# Patient Record
Sex: Female | Born: 1975 | Hispanic: Yes | Marital: Single | Smoking: Never smoker
Health system: Southern US, Community
[De-identification: ages and names within clinical notes are randomized; demographics above are authoritative.]

## PROBLEM LIST (undated history)

## (undated) ENCOUNTER — Inpatient Hospital Stay (HOSPITAL_COMMUNITY): Payer: Self-pay

## (undated) DIAGNOSIS — B91 Sequelae of poliomyelitis: Secondary | ICD-10-CM

## (undated) DIAGNOSIS — M89669 Osteopathy after poliomyelitis, unspecified lower leg: Secondary | ICD-10-CM

## (undated) HISTORY — DX: Sequelae of poliomyelitis: B91

## (undated) HISTORY — DX: Osteopathy after poliomyelitis, unspecified lower leg: M89.669

## (undated) HISTORY — PX: LEG SURGERY: SHX1003

---
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
## 2007-10-04 ENCOUNTER — Ambulatory Visit: Payer: Self-pay | Admitting: Obstetrics & Gynecology

## 2007-10-05 ENCOUNTER — Ambulatory Visit (HOSPITAL_COMMUNITY): Admission: RE | Admit: 2007-10-05 | Discharge: 2007-10-05 | Payer: Self-pay | Admitting: Obstetrics & Gynecology

## 2007-10-18 ENCOUNTER — Ambulatory Visit: Payer: Self-pay | Admitting: Obstetrics & Gynecology

## 2007-11-01 ENCOUNTER — Ambulatory Visit: Payer: Self-pay | Admitting: Obstetrics & Gynecology

## 2007-11-22 ENCOUNTER — Ambulatory Visit: Payer: Self-pay | Admitting: Obstetrics & Gynecology

## 2007-11-29 ENCOUNTER — Ambulatory Visit: Payer: Self-pay | Admitting: Obstetrics & Gynecology

## 2007-12-06 ENCOUNTER — Ambulatory Visit: Payer: Self-pay | Admitting: Obstetrics & Gynecology

## 2007-12-07 ENCOUNTER — Ambulatory Visit: Payer: Self-pay | Admitting: Advanced Practice Midwife

## 2007-12-07 ENCOUNTER — Inpatient Hospital Stay (HOSPITAL_COMMUNITY): Admission: AD | Admit: 2007-12-07 | Discharge: 2007-12-09 | Payer: Self-pay | Admitting: Obstetrics & Gynecology

## 2010-08-04 ENCOUNTER — Emergency Department (HOSPITAL_COMMUNITY)
Admission: EM | Admit: 2010-08-04 | Discharge: 2010-08-05 | Payer: Self-pay | Source: Home / Self Care | Admitting: Emergency Medicine

## 2010-10-12 LAB — POCT I-STAT, CHEM 8
BUN: <3 mg/dL — ABNORMAL LOW (ref 6–23)
Calcium, Ion: 1.13 mmol/L (ref 1.12–1.32)
Chloride: 109 mEq/L (ref 96–112)
Creatinine, Ser: 0.4 mg/dL (ref 0.4–1.2)
Glucose, Bld: 106 mg/dL — ABNORMAL HIGH (ref 70–99)
HCT: 34 % — ABNORMAL LOW (ref 36.0–46.0)
Hemoglobin: 11.6 g/dL — ABNORMAL LOW (ref 12.0–15.0)
Potassium: 3.2 mEq/L — ABNORMAL LOW (ref 3.5–5.1)
Sodium: 140 mEq/L (ref 135–145)
TCO2: 20 mmol/L (ref 0–100)

## 2010-10-12 LAB — WET PREP, GENITAL
Clue Cells Wet Prep HPF POC: NONE SEEN
Trich, Wet Prep: NONE SEEN
WBC, Wet Prep HPF POC: NONE SEEN
Yeast Wet Prep HPF POC: NONE SEEN

## 2010-10-12 LAB — ABO/RH: ABO/RH(D): O POS

## 2010-10-12 LAB — GC/CHLAMYDIA PROBE AMP, GENITAL
Chlamydia, DNA Probe: NEGATIVE
GC Probe Amp, Genital: NEGATIVE

## 2010-10-14 ENCOUNTER — Inpatient Hospital Stay (HOSPITAL_COMMUNITY): Payer: Medicaid Other

## 2010-10-14 ENCOUNTER — Inpatient Hospital Stay (HOSPITAL_COMMUNITY)
Admission: AD | Admit: 2010-10-14 | Discharge: 2010-10-14 | Disposition: A | Discharge status: Home / Self Care | Payer: Medicaid Other | Source: Ambulatory Visit | Attending: Family Medicine | Admitting: Family Medicine

## 2010-10-14 DIAGNOSIS — O4690 Antepartum hemorrhage, unspecified, unspecified trimester: Secondary | ICD-10-CM

## 2010-10-14 LAB — CBC
HCT: 36.5 % (ref 36.0–46.0)
Hemoglobin: 12.1 g/dL (ref 12.0–15.0)
MCH: 29.5 pg (ref 26.0–34.0)
MCHC: 33.2 g/dL (ref 30.0–36.0)
MCV: 89 fL (ref 78.0–100.0)
Platelets: 244 10*3/uL (ref 150–400)
RBC: 4.1 MIL/uL (ref 3.87–5.11)
RDW: 13.8 % (ref 11.5–15.5)
WBC: 9.2 10*3/uL (ref 4.0–10.5)

## 2010-10-14 LAB — WET PREP, GENITAL
Clue Cells Wet Prep HPF POC: NONE SEEN
Trich, Wet Prep: NONE SEEN
Yeast Wet Prep HPF POC: NONE SEEN

## 2010-10-15 LAB — GC/CHLAMYDIA PROBE AMP, GENITAL
Chlamydia, DNA Probe: NEGATIVE
GC Probe Amp, Genital: NEGATIVE

## 2010-10-21 ENCOUNTER — Encounter: Payer: Self-pay | Admitting: Family Medicine

## 2010-10-21 LAB — CONVERTED CEMR LAB
INR: 0.9 (ref <1.50)
INR: 0.9 (ref <1.50)
Prothrombin Time: 12.4 s (ref 11.6–15.2)
Prothrombin Time: 12.4 s (ref 11.6–15.2)
aPTT: 28 s (ref 24–37)
aPTT: 28 s (ref 24–37)

## 2010-10-21 LAB — APTT: aPTT: 28 seconds (ref 24–37)

## 2010-10-21 LAB — PROTIME-INR
INR: 0.9 (ref <1.50)
Prothrombin Time: 12.4 seconds (ref 11.6–15.2)

## 2011-02-02 ENCOUNTER — Inpatient Hospital Stay (HOSPITAL_COMMUNITY)
Admission: AD | Admit: 2011-02-02 | Discharge: 2011-02-03 | DRG: 775 | Disposition: A | Discharge status: Home / Self Care | Payer: Medicaid Other | Source: Ambulatory Visit | Attending: Obstetrics & Gynecology | Admitting: Obstetrics & Gynecology

## 2011-02-02 DIAGNOSIS — O09529 Supervision of elderly multigravida, unspecified trimester: Secondary | ICD-10-CM | POA: Diagnosis present

## 2011-02-02 LAB — CBC
HCT: 41.5 % (ref 36.0–46.0)
Hemoglobin: 14.1 g/dL (ref 12.0–15.0)
MCH: 29.7 pg (ref 26.0–34.0)
MCHC: 34 g/dL (ref 30.0–36.0)
MCV: 87.4 fL (ref 78.0–100.0)
Platelets: 218 10*3/uL (ref 150–400)
RBC: 4.75 MIL/uL (ref 3.87–5.11)
RDW: 14.1 % (ref 11.5–15.5)
WBC: 8.9 10*3/uL (ref 4.0–10.5)

## 2011-02-02 LAB — RPR: RPR Ser Ql: NONREACTIVE

## 2011-04-26 LAB — POCT URINALYSIS DIP (DEVICE)
Bilirubin Urine: NEGATIVE
Glucose, UA: NEGATIVE
Ketones, ur: NEGATIVE
Nitrite: NEGATIVE
Operator id: 297281
Protein, ur: NEGATIVE
Specific Gravity, Urine: 1.015
Urobilinogen, UA: 0.2
pH: 7

## 2011-04-27 LAB — POCT URINALYSIS DIP (DEVICE)
Bilirubin Urine: NEGATIVE
Bilirubin Urine: NEGATIVE
Bilirubin Urine: NEGATIVE
Glucose, UA: NEGATIVE
Glucose, UA: NEGATIVE
Glucose, UA: NEGATIVE
Hgb urine dipstick: NEGATIVE
Hgb urine dipstick: NEGATIVE
Hgb urine dipstick: NEGATIVE
Ketones, ur: NEGATIVE
Ketones, ur: NEGATIVE
Ketones, ur: NEGATIVE
Nitrite: NEGATIVE
Nitrite: NEGATIVE
Nitrite: NEGATIVE
Operator id: 159681
Operator id: 297281
Operator id: 297281
Protein, ur: NEGATIVE
Protein, ur: NEGATIVE
Protein, ur: NEGATIVE
Specific Gravity, Urine: <=1.005
Specific Gravity, Urine: 1.01
Specific Gravity, Urine: 1.01
Urobilinogen, UA: 0.2
Urobilinogen, UA: 0.2
Urobilinogen, UA: 0.2
pH: 6.5
pH: 6.5
pH: 7

## 2012-02-02 ENCOUNTER — Ambulatory Visit (INDEPENDENT_AMBULATORY_CARE_PROVIDER_SITE_OTHER): Payer: Self-pay | Admitting: Family Medicine

## 2012-02-02 ENCOUNTER — Encounter: Payer: Self-pay | Admitting: Family Medicine

## 2012-02-02 VITALS — BP 116/78 | HR 65 | Ht 59.5 in | Wt 110.0 lb

## 2012-02-02 DIAGNOSIS — R51 Headache: Secondary | ICD-10-CM

## 2012-02-02 DIAGNOSIS — B91 Sequelae of poliomyelitis: Secondary | ICD-10-CM

## 2012-02-02 DIAGNOSIS — A809 Acute poliomyelitis, unspecified: Secondary | ICD-10-CM

## 2012-02-02 DIAGNOSIS — R519 Headache, unspecified: Secondary | ICD-10-CM | POA: Insufficient documentation

## 2012-02-02 DIAGNOSIS — M89669 Osteopathy after poliomyelitis, unspecified lower leg: Secondary | ICD-10-CM | POA: Insufficient documentation

## 2012-02-02 MED ORDER — KETOROLAC TROMETHAMINE 30 MG/ML IJ SOLN
30.0000 mg | Freq: Once | INTRAMUSCULAR | Status: AC
  Administered 2012-02-02: 30 mg via INTRAMUSCULAR

## 2012-02-02 NOTE — Progress Notes (Signed)
Subjective:     Patient ID: Jade Horn, female   DOB: [birth year], 36 y.o.   MRN: [id number]  HPI Patient is a new patient. She has not been seen by PCP in the past. She is here to establish care and has a concern about her headache and crutches use.  1. Headache- No history of headaches. Headache started 3 weeks ago and has not completely resolved. Has taken Advil which helped. Some photophobia, no nausea/vomiting. Some blurry vision, but not new or related to headaches. She has 2 children and would like to feel better. She does have changes in vision for a while now but not related to the headaches, although difficulty seeing at a distance could be contributing.   2. Pain in hand- Pain has been going on for a long time, but worse within the last week. Most likely secondary to using crutches. She has used standard crutches for 20 years, and never used any other type of support. Endorses numbness and tingling in her arms. She states she also has some pain down her sides which she feels is also from using her crutches. No fevers, no decreased ROM, no redness of joints.   3. Health Maintenance - Needs an eye appointment. BP within normal limits. Last pap smear during pregnancy last year. Awaiting Orange Card  Patient's social worker is with her, and has interpreted her entire interview and exam.   Review of Systems Please see HPI above    Objective:   Physical Exam  Constitutional: She appears well-developed and well-nourished. No distress.  HENT:  Head: Normocephalic and atraumatic.  Neck: Normal range of motion.  Cardiovascular: Normal rate and regular rhythm.   No murmur heard. Pulmonary/Chest: Effort normal and breath sounds normal.  Abdominal: Soft. There is no tenderness.  Musculoskeletal: Normal range of motion. She exhibits no edema and no tenderness.       Good grip bilaterally. Decreased ROM on right hip. Unable to flex/extend right knee or ankle. Atrophy of right leg.  Multiple healed surgical scars on right thigh.  Lymphadenopathy:    She has no cervical adenopathy.   Assessment:     36 female establishing care    Plan:

## 2012-02-02 NOTE — Patient Instructions (Signed)
It was nice to meet you today!  I hope your headache feels better soon. You can take Tylenol Extra Strength (500mg ) every 6 hours for the pain, OR Take Excedrin Headache relief. (Please do not take these together.)  Once you have the orange card, we will arrange for you to be seen at Sports Medicine to discuss your crutches.  Also, I think it is important for you to make an eye appointment, if possible. Your vision could be contributing to your headache.  Happy birthday to your little girl! I will see you back in the office in 2 months, or sooner if you need anything.  Druscilla Petsch M. Jeffren Dombek, M.D.

## 2012-02-02 NOTE — Assessment & Plan Note (Addendum)
Tried OTC nsaids with relief. Will give Toradol shot here in the office today since abortive Rx are expensive. Also, patient is concerned about feeling sleepy so we will start with Toradol only. At home, she can continue to take Motrin, Excedrin or Tylenol as needed for the pain. If her headache worsens, she has changes in her vision, or has any other concerns she should return to clinic to be evaluated.

## 2012-02-02 NOTE — Assessment & Plan Note (Signed)
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 She has always walked with standard crutches. Recently with her headache and fatigue, she has noticed she has had increased numbness down arms and pain in flanks from the crutches. She does not have any other option for walking. Since patient does not have insurance, I called Sports Medicine clinic. They stated they would not be able to offer much else for the crutches. She should go to a medical supply store to see if they have more appropriate crutches for her. I wrote a prescription stating her problem and for eval/treat for new crutches. Patient states understanding. Will follow up in 1-2 months, or sooner if needed.

## 2014-06-03 ENCOUNTER — Encounter: Payer: Self-pay | Admitting: Family Medicine

## 2015-08-03 NOTE — L&D Delivery Note (Signed)
Delivery Note Progressed quickly to complete dilation. Pushed well to complete the SVD.   At 4:52 PM a viable and healthy female was delivered via Vaginal, Spontaneous Delivery (Presentation: LOA ).  APGAR: 8, 9; weight pending .   Placenta status: Spontaneous and grossly intact with 3VC,   with the following complications: none   Small left Bartholins cyst noted after delivery. No signs of abscess.  Anesthesia:   Episiotomy: None Lacerations: Perineal abrasion, not repaired Suture Repair: none Est. Blood Loss (mL): 100  Mom to postpartum.  Baby to Couplet care / Skin to Skin.  [facility] Surgery CenterWILLIAMS,Jade Fiallos 05/17/2016, 5:17 PM

## 2015-09-12 LAB — CYTOLOGY - PAP: PAP SMEAR: NEGATIVE

## 2015-10-27 ENCOUNTER — Encounter (HOSPITAL_COMMUNITY): Payer: Self-pay | Admitting: *Deleted

## 2015-10-27 ENCOUNTER — Inpatient Hospital Stay (HOSPITAL_COMMUNITY)
Admission: AD | Admit: 2015-10-27 | Discharge: 2015-10-27 | Disposition: A | Discharge status: Home / Self Care | Payer: Self-pay | Source: Ambulatory Visit | Attending: Family Medicine | Admitting: Family Medicine

## 2015-10-27 DIAGNOSIS — O200 Threatened abortion: Secondary | ICD-10-CM

## 2015-10-27 DIAGNOSIS — O209 Hemorrhage in early pregnancy, unspecified: Secondary | ICD-10-CM | POA: Insufficient documentation

## 2015-10-27 DIAGNOSIS — Z3A11 11 weeks gestation of pregnancy: Secondary | ICD-10-CM | POA: Insufficient documentation

## 2015-10-27 LAB — ABO/RH: ABO/RH(D): O POS

## 2015-10-27 LAB — URINALYSIS, ROUTINE W REFLEX MICROSCOPIC
BILIRUBIN URINE: NEGATIVE
GLUCOSE, UA: NEGATIVE mg/dL
KETONES UR: NEGATIVE mg/dL
Nitrite: NEGATIVE
PROTEIN: NEGATIVE mg/dL
Specific Gravity, Urine: 1.01 (ref 1.005–1.030)
pH: 6 (ref 5.0–8.0)

## 2015-10-27 LAB — URINE MICROSCOPIC-ADD ON: Bacteria, UA: NONE SEEN

## 2015-10-27 NOTE — Discharge Instructions (Signed)
Hemorragia vaginal durante el embarazo (primer trimestre)  (Vaginal Bleeding During Pregnancy, First Trimester)  Durante los primeros meses del embarazo es relativamente frecuente que se presente una pequeña hemorragia (manchas). Esta situación generalmente mejora por sí misma. Estas hemorragias o manchas tienen diversas causas al inicio del embarazo. Algunas manchas pueden estar relacionadas al embarazo y otras no. En la mayoría de los casos, la hemorragia es normal y no es un problema. Sin embargo, la hemorragia también puede ser un signo de algo grave. Debe informar a su médico de inmediato si tiene alguna hemorragia vaginal.  Algunas causas posibles de hemorragia vaginal durante el primer trimestre incluyen:  · Infección o inflamación del cuello del útero.  · Crecimientos anormales (pólipos) en el cuello del útero.  · Aborto espontáneo o amenaza de aborto espontáneo.  · Tejido del embarazo se ha desarrollado fuera del útero y en una trompa de falopio (embarazo ectópico).  · Se han desarrollado pequeños quistes en el útero en lugar de tejido de embarazo (embarazo molar).  INSTRUCCIONES PARA EL CUIDADO EN EL HOGAR   Controle su afección para ver si hay cambios. Las siguientes indicaciones ayudarán a aliviar cualquier molestia que pueda sentir:  · Siga las indicaciones del médico para restringir su actividad. Si el médico le indica descanso en la cama, debe quedarse en la cama y levantarse solo para ir al baño. No obstante, el médico puede permitirle que continué con tareas livianas.  · Si es necesario, organícese para que alguien le ayude con las actividades y responsabilidades cotidianas mientras está en cama.  · Lleve un registro de la cantidad y la saturación de las toallas higiénicas que utiliza cada día. Anote este dato.  · No use tampones.No se haga duchas vaginales.  · No tenga relaciones sexuales u orgasmos hasta que el médico la autorice.  · Si elimina tejido por la vagina, guárdelo para mostrárselo al  médico.  · Tome solo medicamentos de venta libre o recetados, según las indicaciones del médico.  · No tome aspirina, ya que puede causar hemorragias.  · Cumpla con todas las visitas de control, según le indique su médico.  SOLICITE ATENCIÓN MÉDICA SI:  · Tiene un sangrado vaginal en cualquier momento del embarazo.  · Tiene calambres o dolores de parto.  · Tiene fiebre que los medicamentos no pueden controlar.  SOLICITE ATENCIÓN MÉDICA DE INMEDIATO SI:   · Siente calambres intensos en la espalda o en el vientre (abdomen).  · Elimina coágulos grandes o tejido por la vagina.  · La hemorragia aumenta.  · Si se siente mareada, débil o se desmaya.  · Tiene escalofríos.  · Tiene una pérdida importante o sale líquido a borbotones por la vagina.  · Se desmaya mientras defeca.  ASEGÚRESE DE QUE:  · Comprende estas instrucciones.  · Controlará su afección.  · Recibirá ayuda de inmediato si no mejora o si empeora.     Esta información no tiene como fin reemplazar el consejo del médico. Asegúrese de hacerle al médico cualquier pregunta que tenga.     Document Released: 04/28/2005 Document Revised: 07/24/2013  Elsevier Interactive Patient Education ©2016 [employer] Inc.

## 2015-10-27 NOTE — MAU Note (Signed)
Patient doing well, no needs at this time. Awaiting lab results.

## 2015-10-27 NOTE — MAU Provider Note (Signed)
History   G3P2002 in c/o vaginal bleeding that started yesterday also passed sm clot yesterday. Today has only been mild cramping and spotting.  CSN: [id number]  Arrival date & time 10/27/15  1221   None     Chief Complaint  Patient presents with  . Vaginal Bleeding  . Abdominal Cramping    HPI  Past Medical History  Diagnosis Date  . Medical history non-contributory     Past Surgical History  Procedure Laterality Date  . Leg surgery Right     6 times right leg and foot, due to congenital abnormality    No family history on file.  Social History  Substance Use Topics  . Smoking status: None  . Smokeless tobacco: None  . Alcohol Use: None    OB History    Gravida Para Term Preterm AB TAB SAB Ectopic Multiple Living   3         2      Review of Systems  Constitutional: Negative.   HENT: Negative.   Eyes: Negative.   Respiratory: Negative.   Cardiovascular: Negative.   Gastrointestinal: Positive for abdominal pain.  Endocrine: Negative.   Genitourinary: Positive for vaginal bleeding.  Musculoskeletal: Negative.   Skin: Negative.   Allergic/Immunologic: Negative.   Neurological: Negative.   Hematological: Negative.   Psychiatric/Behavioral: Negative.     Allergies  Review of patient's allergies indicates no known allergies.  Home Medications  No current outpatient prescriptions on file.  BP 114/56 mmHg  Pulse 74  Temp(Src) 99.6 F (37.6 C) (Oral)  Resp 18  LMP 08/12/2015 (Approximate)  Physical Exam  Constitutional: She is oriented to person, place, and time. She appears well-developed and well-nourished.  HENT:  Head: Normocephalic.  Right Ear: External ear normal.  Mouth/Throat: Oropharynx is clear and moist.  Eyes: Pupils are equal, round, and reactive to light.  Neck: Normal range of motion.  Cardiovascular: Normal rate, regular rhythm, normal heart sounds and intact distal pulses.   Pulmonary/Chest: Effort normal and breath sounds  normal.  Abdominal: Soft. Bowel sounds are normal.  Genitourinary: Vagina normal and uterus normal.  Musculoskeletal: Normal range of motion.  Neurological: She is alert and oriented to person, place, and time. She has normal reflexes.  Skin: Skin is warm and dry.  Psychiatric: She has a normal mood and affect. Her behavior is normal. Judgment and thought content normal.    MAU Course  Procedures (including critical care time)  Labs Reviewed  URINALYSIS, ROUTINE W REFLEX MICROSCOPIC (NOT AT [facility])  ABO/RH   No results found.   No diagnosis found.    MDM  FHR 164 st and reg with doppler. No active bleeding present with exam. Will ABO and rh. Then d/c home with precautions. Will send message to low risk clinic to get pt in for care, will get OP u/s.

## 2015-11-04 ENCOUNTER — Ambulatory Visit (HOSPITAL_COMMUNITY)
Admission: RE | Admit: 2015-11-04 | Discharge: 2015-11-04 | Disposition: A | Discharge status: Home / Self Care | Payer: Self-pay | Source: Ambulatory Visit | Attending: Obstetrics and Gynecology | Admitting: Obstetrics and Gynecology

## 2015-11-04 ENCOUNTER — Encounter: Payer: Self-pay | Admitting: Obstetrics & Gynecology

## 2015-11-04 ENCOUNTER — Other Ambulatory Visit (HOSPITAL_COMMUNITY): Payer: Self-pay | Admitting: Obstetrics and Gynecology

## 2015-11-04 DIAGNOSIS — Z3A11 11 weeks gestation of pregnancy: Secondary | ICD-10-CM | POA: Insufficient documentation

## 2015-11-04 DIAGNOSIS — O209 Hemorrhage in early pregnancy, unspecified: Secondary | ICD-10-CM

## 2015-11-04 DIAGNOSIS — O200 Threatened abortion: Secondary | ICD-10-CM | POA: Insufficient documentation

## 2015-11-04 NOTE — Progress Notes (Signed)
Ultrasounds Results Note  SUBJECTIVE HPI:  [name] is a 40 y.o. G3P0 at 8133w0d by LMP who is here for followup ultrasound results.   Patient is Spanish-speaking only, Spanish interpreter present for this encounter.  She was seen in the MAU on 10/27/2015 for bleeding in early pregnancy.  FHR was opbtained by doppler, U/S scheduled for today.  The patient denies abdominal pain but endorses small amount of vaginal bleeding.     Past Medical History  Diagnosis Date  . Medical history non-contributory    Past Surgical History  Procedure Laterality Date  . Leg surgery Right     6 times right leg and foot, due to congenital abnormality   Social History   Social History  . Marital Status: Single    Spouse Name: N/A  . Number of Children: N/A  . Years of Education: N/A   Occupational History  . Not on file.   Social History Main Topics  . Smoking status: Not on file  . Smokeless tobacco: Not on file  . Alcohol Use: Not on file  . Drug Use: Not on file  . Sexual Activity: Yes   Other Topics Concern  . Not on file   Social History Narrative  . No narrative on file   Current Outpatient Prescriptions on File Prior to Encounter  Medication Sig Dispense Refill  . Prenatal Vit-Fe Fumarate-FA (PRENATAL MULTIVITAMIN) TABS tablet Take 1 tablet by mouth daily at 12 noon.     No current facility-administered medications on file prior to encounter.   No Known Allergies  I have reviewed patient's Past Medical Hx, Surgical Hx, Family Hx, Social Hx, medications and allergies.   Review of Systems Review of Systems  Constitutional: Negative for fever and chills.  Gastrointestinal: Negative for nausea, vomiting, abdominal pain, diarrhea and constipation.  Genitourinary: Negative for dysuria.  Musculoskeletal: Negative for back pain.  Neurological: Negative for dizziness and weakness.    Physical Exam  LMP 08/12/2015 (Approximate)  GENERAL: Well-developed,  well-nourished female in no acute distress.  HEENT: Normocephalic, atraumatic.   LUNGS: Effort normal ABDOMEN: soft, non-tender HEART: Regular rate  SKIN: Warm, dry and without erythema PSYCH: Normal mood and affect NEURO: Alert and oriented x 4   IMAGING [place] Ob Comp Less 14 Wks  11/04/2015  CLINICAL DATA:  Vaginal bleeding in first trimester pregnancy. Threatened abortion. Gestational age by LMP of 12 weeks 0 days EXAM: OBSTETRIC <14 WK ULTRASOUND TECHNIQUE: Transabdominal ultrasound was performed for evaluation of the gestation as well as the maternal uterus and adnexal regions. COMPARISON:  None. FINDINGS: Intrauterine gestational sac: Single Yolk sac:  Visualized Embryo:  Visualized Cardiac Activity: Visualized Heart Rate: 171 bpm CRL:   42  mm   11 w 0 d                  US EDC: 05/25/2016 Subchorionic hemorrhage:  Small subchorionic hemorrhage noted. Maternal uterus/adnexae: Probable tiny less than 1 cm fibroid in the posterior uterine fundus. Both ovaries are normal in appearance. No adnexal mass or free fluid identified. IMPRESSION: Single living IUP measuring 11 weeks 0 days with US EDC of 05/25/2016. Small subchorionic hemorrhage and probable tiny less than 1 cm posterior uterine fibroid. Electronically Signed   By: Jade Horn  Stahl M.D.   On: 11/04/2015 15:23    ASSESSMENT 1. Threatened abortion in early pregnancy   2. Vaginal bleeding in pregnancy, first trimester     PLAN Discharge home in stable condition Patient  advised to continue taking prenatal vitamins Pregnancy confirmation letter given Patient advised to start prenatal care with [facility] provider of choice as soon as possible  Jade Newcomer, MD  11/04/2015  3:39 PM

## 2015-11-25 ENCOUNTER — Telehealth: Payer: Self-pay

## 2015-11-25 ENCOUNTER — Ambulatory Visit (INDEPENDENT_AMBULATORY_CARE_PROVIDER_SITE_OTHER): Payer: Self-pay | Admitting: Obstetrics and Gynecology

## 2015-11-25 ENCOUNTER — Encounter: Payer: Self-pay | Admitting: Obstetrics and Gynecology

## 2015-11-25 VITALS — BP 114/61 | HR 77 | Temp 98.5°F | Wt 129.4 lb

## 2015-11-25 DIAGNOSIS — Z3490 Encounter for supervision of normal pregnancy, unspecified, unspecified trimester: Secondary | ICD-10-CM | POA: Insufficient documentation

## 2015-11-25 DIAGNOSIS — Z3492 Encounter for supervision of normal pregnancy, unspecified, second trimester: Secondary | ICD-10-CM

## 2015-11-25 DIAGNOSIS — O09529 Supervision of elderly multigravida, unspecified trimester: Secondary | ICD-10-CM | POA: Insufficient documentation

## 2015-11-25 DIAGNOSIS — Z3402 Encounter for supervision of normal first pregnancy, second trimester: Secondary | ICD-10-CM

## 2015-11-25 DIAGNOSIS — Z789 Other specified health status: Secondary | ICD-10-CM

## 2015-11-25 DIAGNOSIS — Z758 Other problems related to medical facilities and other health care: Secondary | ICD-10-CM | POA: Insufficient documentation

## 2015-11-25 DIAGNOSIS — O09522 Supervision of elderly multigravida, second trimester: Secondary | ICD-10-CM

## 2015-11-25 DIAGNOSIS — Z113 Encounter for screening for infections with a predominantly sexual mode of transmission: Secondary | ICD-10-CM

## 2015-11-25 DIAGNOSIS — O24419 Gestational diabetes mellitus in pregnancy, unspecified control: Secondary | ICD-10-CM

## 2015-11-25 LAB — POCT URINALYSIS DIP (DEVICE)
Bilirubin Urine: NEGATIVE
Glucose, UA: NEGATIVE mg/dL
Ketones, ur: NEGATIVE mg/dL
NITRITE: NEGATIVE
PH: 6.5 (ref 5.0–8.0)
PROTEIN: NEGATIVE mg/dL
Specific Gravity, Urine: >=1.03 (ref 1.005–1.030)
UROBILINOGEN UA: 1 mg/dL (ref 0.0–1.0)

## 2015-11-25 NOTE — Telephone Encounter (Signed)
Error

## 2015-11-25 NOTE — Patient Instructions (Signed)

## 2015-11-25 NOTE — Progress Notes (Signed)
Fundus at [id number]  Subjective:    Jade HackerCatalina Horn is a G3P0 5923w0d being seen today for her first obstetrical visit.  Her obstetrical history is significant for advanced maternal age. Patient does intend to breast feed. Pregnancy history fully reviewed.  Patient reports no complaints.  Filed Vitals:   11/25/15 1324  BP: 114/61  Pulse: 77  Temp: 98.5 F (36.9 C)  Weight: 129 lb 6.4 oz (58.695 kg)    HISTORY: OB History  Gravida Para Term Preterm AB SAB TAB Ectopic Multiple Living  3         2    # Outcome Date GA Lbr Len/2nd Weight Sex Delivery Anes PTL Lv  3 Current           2 Gravida 02/02/11   7 lb (3.175 kg) F Vag-Spont   Y  1 Gravida 12/07/07   7 lb (3.175 kg)  Vag-Spont  Y      Past Medical History  Diagnosis Date  . Medical history non-contributory    Past Surgical History  Procedure Laterality Date  . Leg surgery Right     6 times right leg and foot, due to congenital abnormality   Family History  Problem Relation Age of Onset  . Diabetes Father      Exam    Uterus:   14-16 wk size  Pelvic Exam:    Perineum: No Hemorrhoids, Normal Perineum   Vulva: normal, Bartholin's, Urethra, Skene's normal   Vagina:  normal mucosa, normal discharge       Cervix: multiparous appearance   Adnexa: not evaluated   Bony Pelvis: average  System: Breast:  normal appearance, no masses or tenderness, Inspection negative, Normal to palpation without dominant masses   Skin: normal coloration and turgor, no rashes    Neurologic: oriented, normal, grossly non-focal   Extremities: normal strength, tone, and muscle mass, no musculoskeletal defects noted   HEENT PERRLA, sclera clear, anicteric, neck supple with midline trachea and thyroid without masses   Mouth/Teeth mucous membranes moist, pharynx normal without lesions and dental hygiene good   Neck supple and no masses   Cardiovascular: regular rate and rhythm, no murmurs or gallops   Respiratory:  appears well,  vitals normal, no respiratory distress, acyanotic, normal RR, ear and throat exam is normal, neck free of mass or lymphadenopathy, chest clear, no wheezing, crepitations, rhonchi, normal symmetric air entry   Abdomen: soft, non-tender; bowel sounds normal; no masses,  no organomegaly   Urinary: urethral meatus normal      Assessment:    Pregnancy: G3P0 Patient Active Problem List   Diagnosis Date Noted  . Encounter for supervision of normal pregnancy in second trimester, antepartum 11/25/2015  . AMA (advanced maternal age) multigravida 35+ 11/25/2015  . Language barrier 11/25/2015        Plan:     Initial labs drawn. Early GCT. [facility] Pap done Feb 2017 Quad screen Prenatal vitamins. Problem list reviewed and updated. Genetic Screening discussed Quad Screen: requested.  Ultrasound discussed; fetal survey: requested.ordered  Follow up here in 4 weeks. Genetic counseling  50% of 30 min visit spent on counseling and coordination of care.     POE,DEIRDRE 11/25/2015

## 2015-11-26 LAB — GLUCOSE TOLERANCE, 1 HOUR (50G) W/O FASTING: Glucose, 1 Hr, gestational: 145 mg/dL — ABNORMAL HIGH (ref <140)

## 2015-11-26 LAB — PRENATAL PROFILE (SOLSTAS)
Antibody Screen: NEGATIVE
Basophils Absolute: 0 cells/uL (ref 0–200)
Basophils Relative: 0 %
EOS PCT: 1 %
Eosinophils Absolute: 93 cells/uL (ref 15–500)
HEMATOCRIT: 36.6 % (ref 35.0–45.0)
HIV: NONREACTIVE
Hemoglobin: 12 g/dL (ref 11.7–15.5)
Hepatitis B Surface Ag: NEGATIVE
LYMPHS PCT: 24 %
Lymphs Abs: 2232 cells/uL (ref 850–3900)
MCH: 28.2 pg (ref 27.0–33.0)
MCHC: 32.8 g/dL (ref 32.0–36.0)
MCV: 85.9 fL (ref 80.0–100.0)
MONO ABS: 651 {cells}/uL (ref 200–950)
MPV: 10.3 fL (ref 7.5–12.5)
Monocytes Relative: 7 %
NEUTROS PCT: 68 %
Neutro Abs: 6324 cells/uL (ref 1500–7800)
Platelets: 310 10*3/uL (ref 140–400)
RBC: 4.26 MIL/uL (ref 3.80–5.10)
RDW: 14 % (ref 11.0–15.0)
RH TYPE: POSITIVE
Rubella: 1.76 Index — ABNORMAL HIGH (ref <0.90)
WBC: 9.3 10*3/uL (ref 3.8–10.8)

## 2015-11-26 LAB — GC/CHLAMYDIA PROBE AMP (~~LOC~~) NOT AT ARMC
Chlamydia: NEGATIVE
Neisseria Gonorrhea: NEGATIVE

## 2015-11-27 LAB — PRESCRIPTION MONITORING PROFILE (19 PANEL)
AMPHETAMINE/METH: NEGATIVE ng/mL
Barbiturate Screen, Urine: NEGATIVE ng/mL
Benzodiazepine Screen, Urine: NEGATIVE ng/mL
Buprenorphine, Urine: NEGATIVE ng/mL
CANNABINOID SCRN UR: NEGATIVE ng/mL
CARISOPRODOL, URINE: NEGATIVE ng/mL
Cocaine Metabolites: NEGATIVE ng/mL
Creatinine, Urine: 101.68 mg/dL (ref >20.0)
Fentanyl, Ur: NEGATIVE ng/mL
MDMA URINE: NEGATIVE ng/mL
MEPERIDINE UR: NEGATIVE ng/mL
METHADONE SCREEN, URINE: NEGATIVE ng/mL
Methaqualone: NEGATIVE ng/mL
NITRITES URINE, INITIAL: NEGATIVE ug/mL
Opiate Screen, Urine: NEGATIVE ng/mL
Oxycodone Screen, Ur: NEGATIVE ng/mL
PROPOXYPHENE: NEGATIVE ng/mL
Phencyclidine, Ur: NEGATIVE ng/mL
TAPENTADOLUR: NEGATIVE ng/mL
Tramadol Scrn, Ur: NEGATIVE ng/mL
Zolpidem, Urine: NEGATIVE ng/mL
pH, Initial: 6.7 pH (ref 4.5–8.9)

## 2015-11-27 LAB — HEMOGLOBINOPATHY EVALUATION
HGB A2 QUANT: 3 % (ref 2.2–3.2)
HGB F QUANT: 0 % (ref 0.0–2.0)
Hemoglobin Other: 0 %
Hgb A: 97 % (ref 96.8–97.8)
Hgb S Quant: 0 %

## 2015-11-27 LAB — CULTURE, OB URINE
Colony Count: NO GROWTH
ORGANISM ID, BACTERIA: NO GROWTH

## 2015-12-02 ENCOUNTER — Telehealth: Payer: Self-pay

## 2015-12-02 ENCOUNTER — Encounter: Payer: Self-pay | Admitting: Family Medicine

## 2015-12-02 NOTE — Telephone Encounter (Signed)
Patient to be schedule for 3 hour gtt

## 2015-12-03 ENCOUNTER — Encounter: Payer: Self-pay | Admitting: Family Medicine

## 2015-12-03 ENCOUNTER — Encounter: Payer: Self-pay | Admitting: *Deleted

## 2015-12-03 ENCOUNTER — Encounter (HOSPITAL_COMMUNITY): Payer: Self-pay

## 2015-12-03 DIAGNOSIS — Z3480 Encounter for supervision of other normal pregnancy, unspecified trimester: Secondary | ICD-10-CM

## 2015-12-03 NOTE — Telephone Encounter (Signed)
Pt has already been scheduled for her 3 hour

## 2015-12-09 ENCOUNTER — Other Ambulatory Visit: Payer: Self-pay

## 2015-12-09 DIAGNOSIS — Z3480 Encounter for supervision of other normal pregnancy, unspecified trimester: Secondary | ICD-10-CM

## 2015-12-09 DIAGNOSIS — R7309 Other abnormal glucose: Secondary | ICD-10-CM

## 2015-12-10 LAB — GLUCOSE TOLERANCE, 3 HOURS
Glucose Tolerance, 1 hour: 95 mg/dL (ref <190)
Glucose Tolerance, 2 hour: 111 mg/dL (ref <165)
Glucose Tolerance, Fasting: 88 mg/dL (ref 65–104)
Glucose, GTT - 3 Hour: 97 mg/dL (ref <145)

## 2015-12-23 ENCOUNTER — Encounter: Payer: Self-pay | Admitting: Medical

## 2016-01-07 ENCOUNTER — Encounter: Payer: Self-pay | Admitting: Advanced Practice Midwife

## 2016-01-07 ENCOUNTER — Ambulatory Visit (INDEPENDENT_AMBULATORY_CARE_PROVIDER_SITE_OTHER): Payer: Self-pay | Admitting: Advanced Practice Midwife

## 2016-01-07 VITALS — BP 106/69 | HR 68 | Temp 98.0°F | Wt 136.7 lb

## 2016-01-07 DIAGNOSIS — Z3482 Encounter for supervision of other normal pregnancy, second trimester: Secondary | ICD-10-CM

## 2016-01-07 DIAGNOSIS — Z3492 Encounter for supervision of normal pregnancy, unspecified, second trimester: Secondary | ICD-10-CM

## 2016-01-07 DIAGNOSIS — N898 Other specified noninflammatory disorders of vagina: Secondary | ICD-10-CM

## 2016-01-07 DIAGNOSIS — Z113 Encounter for screening for infections with a predominantly sexual mode of transmission: Secondary | ICD-10-CM

## 2016-01-07 DIAGNOSIS — Z364 Encounter for antenatal screening for fetal growth retardation: Secondary | ICD-10-CM

## 2016-01-07 DIAGNOSIS — O26892 Other specified pregnancy related conditions, second trimester: Secondary | ICD-10-CM

## 2016-01-07 LAB — POCT URINALYSIS DIP (DEVICE)
Bilirubin Urine: NEGATIVE
Glucose, UA: NEGATIVE mg/dL
Hgb urine dipstick: NEGATIVE
Ketones, ur: NEGATIVE mg/dL
Nitrite: NEGATIVE
Protein, ur: NEGATIVE mg/dL
Specific Gravity, Urine: 1.015 (ref 1.005–1.030)
Urobilinogen, UA: 0.2 mg/dL (ref 0.0–1.0)
pH: 7 (ref 5.0–8.0)

## 2016-01-07 NOTE — Progress Notes (Signed)
Anatomy US scheduled for June 14th @ 1100.  Pt notified.

## 2016-01-07 NOTE — Progress Notes (Signed)
Subjective:  Jade Horn is a 40 y.o. G3P2001 at 7180w1d being seen today for ongoing prenatal care.  She is currently monitored for the following issues for this low-risk pregnancy and has Headache(784.0); Polio osteopathy of lower leg (HCC); Supervision of normal pregnancy, antepartum; AMA (advanced maternal age) multigravida 35+; and Language barrier on her problem list. Visit completed with use of Spanish interpreter.  Patient reports no bleeding, no contractions, no cramping, no leaking of fluid, but complains of mild vaginal discharge with odor.  Contractions: Not present. Vag. Bleeding: None.  Movement: Absent. Denies leaking of fluid.   The following portions of the patient's history were reviewed and updated as appropriate: allergies, current medications, past family history, past medical history, past social history, past surgical history and problem list. Problem list updated.  Objective:   Filed Vitals:   01/07/16 0958  BP: 106/69  Pulse: 68  Temp: 98 F (36.7 C)  Weight: 136 lb 11.2 oz (62.007 kg)    Fetal Status: Fetal Heart Rate (bpm): 150 Fundal Height: 22 cm Movement: Absent     General:  Alert, oriented and cooperative. Patient is in no acute distress.  Skin: Skin is warm and dry. No rash noted.   Cardiovascular: Normal heart rate noted  Respiratory: Normal respiratory effort, no problems with respiration noted  Abdomen: Soft, gravid, appropriate for gestational age. Pain/Pressure: Absent     Pelvic: Vag. Bleeding: None Vag D/C Character: none  Cervical exam deferred       Wet prep and GC/Chlamydia collected  Extremities: Normal range of motion.  Edema: None  Mental Status: Normal mood and affect. Normal behavior. Normal judgment and thought content.   Urinalysis: Urine Protein: Negative Urine Glucose: Negative  Assessment and Plan:  Pregnancy: G3P2001 at 2580w1d  1. Antenatal screening for fetal growth retardation using ultrasonics - US MFM OB COMP + 14  WK; Next week  2. Supervision of normal pregnancy in second trimester - US MFM OB COMP + 14 WK; Future  3. Vaginal discharge during pregnancy in second trimester - Wet prep, genital - GC/Chlamydia probe amp ([place]  Preterm labor symptoms and general obstetric precautions including but not limited to vaginal bleeding, contractions, leaking of fluid and fetal movement were reviewed in detail with the patient. Please refer to After Visit Summary for other counseling recommendations.  Return in about 4 weeks (around 02/04/2016).   Lowella Dellolleen L Blessed Girdner, Med Student

## 2016-01-07 NOTE — Patient Instructions (Signed)

## 2016-01-07 NOTE — Progress Notes (Signed)
Pt reports that she had bleeding in April but her appt was canceled because pt reported to MAU they told her she was fine.  Pt states concern today about vaginal discharge that smells "brownish"   Pt reports not feeling fetal movement and that she has only felt it once.

## 2016-01-08 ENCOUNTER — Telehealth: Payer: Self-pay | Admitting: General Practice

## 2016-01-08 DIAGNOSIS — B9689 Other specified bacterial agents as the cause of diseases classified elsewhere: Secondary | ICD-10-CM

## 2016-01-08 DIAGNOSIS — N760 Acute vaginitis: Principal | ICD-10-CM

## 2016-01-08 LAB — WET PREP, GENITAL
Trich, Wet Prep: NONE SEEN
Yeast Wet Prep HPF POC: NONE SEEN

## 2016-01-08 LAB — GC/CHLAMYDIA PROBE AMP (~~LOC~~) NOT AT ARMC
Chlamydia: NEGATIVE
Neisseria Gonorrhea: NEGATIVE

## 2016-01-08 MED ORDER — METRONIDAZOLE 500 MG PO TABS: 500.0000 mg | ORAL_TABLET | Freq: Two times a day (BID) | ORAL | Status: DC

## 2016-01-08 NOTE — Telephone Encounter (Signed)
Per [name], patient has BV and needs flagyl sent to pharmacy. Called patient with [name] for interpreter & informed her of results & medication sent to pharmacy. Patient verbalized understanding & had no questions

## 2016-01-08 NOTE — Progress Notes (Signed)
I repeated the exam and agree with above.  LoyolaVirginia Emmie Frakes, CNM 01/08/2016 10:08 AM

## 2016-01-13 ENCOUNTER — Encounter (HOSPITAL_COMMUNITY): Payer: Self-pay

## 2016-01-13 ENCOUNTER — Ambulatory Visit (HOSPITAL_COMMUNITY): Payer: Self-pay

## 2016-01-14 ENCOUNTER — Ambulatory Visit (HOSPITAL_COMMUNITY): Admission: RE | Admit: 2016-01-14 | Payer: Self-pay | Source: Ambulatory Visit

## 2016-01-14 ENCOUNTER — Encounter (HOSPITAL_COMMUNITY): Payer: Self-pay

## 2016-02-17 ENCOUNTER — Encounter: Payer: Self-pay | Admitting: Clinical

## 2016-02-17 ENCOUNTER — Ambulatory Visit (INDEPENDENT_AMBULATORY_CARE_PROVIDER_SITE_OTHER): Payer: Self-pay | Admitting: Advanced Practice Midwife

## 2016-02-17 ENCOUNTER — Encounter: Payer: Self-pay | Admitting: Advanced Practice Midwife

## 2016-02-17 VITALS — BP 118/71 | HR 79 | Wt 142.5 lb

## 2016-02-17 DIAGNOSIS — Z3482 Encounter for supervision of other normal pregnancy, second trimester: Secondary | ICD-10-CM

## 2016-02-17 DIAGNOSIS — Z23 Encounter for immunization: Secondary | ICD-10-CM

## 2016-02-17 LAB — CBC
HCT: 31 % — ABNORMAL LOW (ref 35.0–45.0)
Hemoglobin: 10.2 g/dL — ABNORMAL LOW (ref 11.7–15.5)
MCH: 26.2 pg — ABNORMAL LOW (ref 27.0–33.0)
MCHC: 32.9 g/dL (ref 32.0–36.0)
MCV: 79.5 fL — ABNORMAL LOW (ref 80.0–100.0)
MPV: 10.5 fL (ref 7.5–12.5)
Platelets: 341 10*3/uL (ref 140–400)
RBC: 3.9 MIL/uL (ref 3.80–5.10)
RDW: 13.5 % (ref 11.0–15.0)
WBC: 8.9 10*3/uL (ref 3.8–10.8)

## 2016-02-17 LAB — POCT URINALYSIS DIP (DEVICE)
Bilirubin Urine: NEGATIVE
Glucose, UA: NEGATIVE mg/dL
Hgb urine dipstick: NEGATIVE
Ketones, ur: NEGATIVE mg/dL
Nitrite: NEGATIVE
Protein, ur: NEGATIVE mg/dL
Specific Gravity, Urine: 1.015 (ref 1.005–1.030)
Urobilinogen, UA: 0.2 mg/dL (ref 0.0–1.0)
pH: 6.5 (ref 5.0–8.0)

## 2016-02-17 MED ORDER — TETANUS-DIPHTH-ACELL PERTUSSIS 5-2.5-18.5 LF-MCG/0.5 IM SUSP
0.5000 mL | Freq: Once | INTRAMUSCULAR | Status: AC
  Administered 2016-02-17: 0.5 mL via INTRAMUSCULAR

## 2016-02-17 NOTE — Progress Notes (Signed)
Subjective:  Sarajane MarekCatarina Rodriguez-Ortiz is a 40 y.o. G3P2002 at 1615w0d being seen today for ongoing prenatal care.  She is currently monitored for the following issues for this low-risk pregnancy and has Headache(784.0); Polio osteopathy of lower leg (HCC); Supervision of normal pregnancy, antepartum; AMA (advanced maternal age) multigravida 35+; and Language barrier on her problem list.  Patient reports no complaints.  Contractions: Not present.  .  Movement: Present. Denies leaking of fluid.   The following portions of the patient's history were reviewed and updated as appropriate: allergies, current medications, past family history, past medical history, past social history, past surgical history and problem list. Problem list updated.  Objective:   Filed Vitals:   02/17/16 1120  BP: 118/71  Pulse: 79  Weight: 142 lb 8 oz (64.638 kg)    Fetal Status:     Movement: Present     General:  Alert, oriented and cooperative. Patient is in no acute distress.  Skin: Skin is warm and dry. No rash noted.   Cardiovascular: Normal heart rate noted  Respiratory: Normal respiratory effort, no problems with respiration noted  Abdomen: Soft, gravid, appropriate for gestational age. Pain/Pressure: Present     Pelvic:  Cervical exam deferred        Extremities: Normal range of motion.  Edema: None  Mental Status: Normal mood and affect. Normal behavior. Normal judgment and thought content.   Urinalysis:      Assessment and Plan:  Pregnancy: G3P2002 at 2315w0d  1. Supervision of normal pregnancy, antepartum, second trimester     Interpretor used via video     Feels well - Tdap (BOOSTRIX) injection 0.5 mL; Inject 0.5 mLs into the muscle once. - RPR - HIV antibody (with reflex) - CBC - Glucose Tolerance, 1 HR (50g)  Preterm labor symptoms and general obstetric precautions including but not limited to vaginal bleeding, contractions, leaking of fluid and fetal movement were reviewed in detail with the  patient. Please refer to After Visit Summary for other counseling recommendations.  Return in about 4 weeks (around 03/16/2016) for [facility].   Aviva SignsMarie L Williams, CNM

## 2016-02-17 NOTE — Patient Instructions (Signed)
Eleccin del mtodo anticonceptivo (Contraception Choices) La anticoncepcin (control de la natalidad) es el uso de cualquier mtodo o dispositivo para evitar el embarazo. A continuacin se indican algunos de esos mtodos. MTODOS HORMONALES   El Implante contraconceptivo consiste en un tubo plstico delgado que contiene la hormona progesterona. No contiene estrgenos. El mdico inserta el tubo en la parte interna del brazo. El tubo puede permanecer en el lugar durante 3 aos. Despus de los 3 aos debe retirarse. El implante impide que los ovarios liberen vulos (ovulacin), espesa el moco cervical, lo que evita que los espermatozoides ingresen al tero y hace ms delgada la membrana que cubre el interior del tero.  Inyecciones de progesterona sola: las administra el mdico cada 3 meses para evitar el embarazo. La progesterona sinttica impide que los ovarios liberen vulos. Tambin hacen que el moco cervical se espese y modifique el tejido de recubrimiento interno del tero. Esto hace ms difcil que los espermatozoides sobrevivan en el tero.  Las pldoras anticonceptivas contienen estrgenos y progesterona. Su funcin es evitar que los ovarios liberen vulos (ovulacin). Las hormonas de los anticonceptivos orales hacen que el moco cervical se haga ms espeso, lo que evita que el esperma ingrese al tero. Las pldoras anticonceptivas son recetadas por el mdico.Tambin se utilizan para tratar los perodos menstruales abundantes.  Minipldora: este tipo de pldora anticonceptiva contiene slo hormona progesterona. Deben tomarse todos los das del mes y debe recetarlas el mdico.  El parche de control de natalidad: contiene hormonas similares a las que contienen las pldoras anticonceptivas. Deben cambiarse una vez por semana y se utilizan bajo prescripcin mdica.  Anillo vaginal: contiene hormonas similares a las que contienen las pldoras anticonceptivas. Se deja colocado durante tres semanas,  se lo retira durante 1 semana y luego se coloca uno nuevo. La paciente debe sentirse cmoda al insertar y retirar el anillo de la vagina.Es necesaria la prescripcin mdica.  Anticonceptivos de emergencia: son mtodos para evitar un embarazo despus de una relacin sexual sin proteccin. Esta pldora puede tomarse inmediatamente despus de tener relaciones sexuales o hasta 5 das de haber tenido sexo sin proteccin. Es ms efectiva si se toma poco tiempo despus de la relacin sexual. Los anticonceptivos de emergencia estn disponibles sin prescripcin mdica. Consltelo con su farmacutico. No use los anticonceptivos de emergencia como nico mtodo anticonceptivo. MTODOS DE BARRERA   Condn masculino: es una vaina delgada (ltex o goma) que se coloca cubriendo al pene durante el acto sexual. Puede usarse con espermicida para aumentar la efectividad.  Condn femenino. Es una funda delicada y blanda que se adapta holgadamente a la vagina antes de las relaciones sexuales.  Diafragma: es una barrera de ltex redonda y suave que debe ser recomendado por un profesional. Se inserta en la vagina, junto con un gel espermicida. Debe insertarse antes de tener relaciones sexuales. Debe dejar el diafragma colocado en la vagina durante 6 a 8 horas despus de la relacin sexual.  Capuchn cervical: es una barrera de ltex o taza plstica redonda y suave que cubre el cuello del tero y debe ser colocada por un mdico. Puede dejarlo colocado en la vagina hasta 48 horas despus de las relaciones sexuales.  Esponja: es una pieza blanda y circular de espuma de poliuretano. Contiene un espermicida. Se inserta en la vagina despus de mojarla y antes de las relaciones sexuales.  Espermicidas: son sustancias qumicas que matan o bloquean al esperma y no lo dejan ingresar al cuello del tero y al tero. Vienen   en forma de cremas, geles, supositorios, espuma o comprimidos. No es necesario tener Emergency planning/management officer. Se insertan en  la vagina con un aplicador antes de Management consultant. El proceso debe repetirse cada vez que tiene relaciones sexuales. ANTICONCEPTIVOS INTRAUTERINOS  Dispositivo intrauterino (DIU) es un dispositivo en forma de T que se coloca en el tero durante el perodo menstrual, para Location manager. Hay dos tipos:  DIU de cobre: este tipo de DIU est recubierto con un alambre de cobre y se inserta dentro del tero. El cobre hace que el tero y las trompas de Falopio produzcan un liquido que [employer] espermatozoides. Puede permanecer colocado durante 10 aos.  DIU con hormona: este tipo de DIU contiene la hormona progestina (progesterona sinttica). La hormona espesa el moco cervical y evita que los espermatozoides ingresen al tero y tambin afina la membrana que cubre el tero para evitar la implantacin del vulo fertilizado. La hormona debilita o destruye los espermatozoides que ingresan al tero. Puede Geneticist, molecular durante 3-5 aos, segn el tipo de DIU que se May. MTODOS ANTICONCEPTIVOS PERMANENTES  Ligadura de trompas en la mujer: se realiza sellando, atando u obstruyendo quirrgicamente las trompas de Falopio lo que impide que el vulo descienda hacia el tero.  Esterilizacin histeroscpica: Implica la colocacin de un pequeo espiral o la insercin en cada trompa de Falopio. El mdico utiliza una tcnica llamada histeroscopa para Primary school teacher procedimiento. El dispositivo produce la formacin de tejido Designer, television/film set. Esto da como resultado una obstruccin permanente de las trompas de Falopio, de modo que la esperma no pueda fertilizar el vulo. Demora alrededor de 3 meses despus del procedimiento hasta que el conducto se obstruye. Tendr que usar otro mtodo anticonceptivo durante al menos 3 meses.  Esterilizacin masculina: se realiza ligando los conductos por los que pasan los espermatozoides (vasectoma).Esto impide que el esperma ingrese a la vagina durante el acto  sexual. Luego del procedimiento, el hombre puede eyacular lquido (semen). MTODOS DE PLANIFICACIN NATURAL  Planificacin familiar natural: consiste en no Management consultant o usar un mtodo de barrera (condn, [place], capuchn cervical) en los [employer] la mujer podra quedar Crimora.  Mtodo de calendario: consiste en el seguimiento de la duracin de cada ciclo menstrual y la identificacin de los perodos frtiles.  Mtodo de ovulacin: Paramedic las relaciones sexuales durante la ovulacin.  Mtodo sintotrmico: Advertising copywriter sexuales en la poca en la que se est ovulando, utilizando un termmetro y tendiendo en cuenta los sntomas de la ovulacin.  Mtodo postovulacin: Youth worker las relaciones sexuales para despus de haber ovulado. Independientemente del tipo o mtodo anticonceptivo que usted elija, es importante que use condones para protegerse contra las infecciones de transmisin sexual (ETS). Hable con su mdico con respecto a qu mtodo anticonceptivo es el ms apropiado para usted.   Esta informacin no tiene Theme park manager el consejo del mdico. Asegrese de hacerle al mdico cualquier pregunta que tenga.   Document Released: 07/19/2005 Document Revised: 03/21/2013 Elsevier Interactive Patient Education [employer] Contraception Choices Contraception (birth control) is the use of any methods or devices to prevent pregnancy. Below are some methods to help avoid pregnancy. HORMONAL METHODS   Contraceptive implant. This is a thin, plastic tube containing progesterone hormone. It does not contain estrogen hormone. Your health care provider inserts the tube in the inner part of the upper arm. The tube can remain in place for up to 3 years. After 3 years, the  implant must be removed. The implant prevents the ovaries from releasing an egg (ovulation), thickens the cervical mucus to prevent sperm from entering the  uterus, and thins the lining of the inside of the uterus.  Progesterone-only injections. These injections are given every 3 months by your health care provider to prevent pregnancy. This synthetic progesterone hormone stops the ovaries from releasing eggs. It also thickens cervical mucus and changes the uterine lining. This makes it harder for sperm to survive in the uterus.  Birth control pills. These pills contain estrogen and progesterone hormone. They work by preventing the ovaries from releasing eggs (ovulation). They also cause the cervical mucus to thicken, preventing the sperm from entering the uterus. Birth control pills are prescribed by a health care provider.Birth control pills can also be used to treat heavy periods.  Minipill. This type of birth control pill contains only the progesterone hormone. They are taken every day of each month and must be prescribed by your health care provider.  Birth control patch. The patch contains hormones similar to those in birth control pills. It must be changed once a week and is prescribed by a health care provider.  Vaginal ring. The ring contains hormones similar to those in birth control pills. It is left in the vagina for 3 weeks, removed for 1 week, and then a new one is put back in place. The patient must be comfortable inserting and removing the ring from the vagina.A health care provider's prescription is necessary.  Emergency contraception. Emergency contraceptives prevent pregnancy after unprotected sexual intercourse. This pill can be taken right after sex or up to 5 days after unprotected sex. It is most effective the sooner you take the pills after having sexual intercourse. Most emergency contraceptive pills are available without a prescription. Check with your pharmacist. Do not use emergency contraception as your only form of birth control. BARRIER METHODS   Female condom. This is a thin sheath (latex or rubber) that is worn over the  penis during sexual intercourse. It can be used with spermicide to increase effectiveness.  Female condom. This is a soft, loose-fitting sheath that is put into the vagina before sexual intercourse.  Diaphragm. This is a soft, latex, dome-shaped barrier that must be fitted by a health care provider. It is inserted into the vagina, along with a spermicidal jelly. It is inserted before intercourse. The diaphragm should be left in the vagina for 6 to 8 hours after intercourse.  Cervical cap. This is a round, soft, latex or plastic cup that fits over the cervix and must be fitted by a health care provider. The cap can be left in place for up to 48 hours after intercourse.  Sponge. This is a soft, circular piece of polyurethane foam. The sponge has spermicide in it. It is inserted into the vagina after wetting it and before sexual intercourse.  Spermicides. These are chemicals that kill or block sperm from entering the cervix and uterus. They come in the form of creams, jellies, suppositories, foam, or tablets. They do not require a prescription. They are inserted into the vagina with an applicator before having sexual intercourse. The process must be repeated every time you have sexual intercourse. INTRAUTERINE CONTRACEPTION  Intrauterine device (IUD). This is a T-shaped device that is put in a woman's uterus during a menstrual period to prevent pregnancy. There are 2 types:  Copper IUD. This type of IUD is wrapped in copper wire and is placed inside the uterus. Copper makes  the uterus and fallopian tubes produce a fluid that kills sperm. It can stay in place for 10 years.  Hormone IUD. This type of IUD contains the hormone progestin (synthetic progesterone). The hormone thickens the cervical mucus and prevents sperm from entering the uterus, and it also thins the uterine lining to prevent implantation of a fertilized egg. The hormone can weaken or kill the sperm that get into the uterus. It can stay in  place for 3-5 years, depending on which type of IUD is used. PERMANENT METHODS OF CONTRACEPTION  Female tubal ligation. This is when the woman's fallopian tubes are surgically sealed, tied, or blocked to prevent the egg from traveling to the uterus.  Hysteroscopic sterilization. This involves placing a small coil or insert into each fallopian tube. Your doctor uses a technique called hysteroscopy to do the procedure. The device causes scar tissue to form. This results in permanent blockage of the fallopian tubes, so the sperm cannot fertilize the egg. It takes about 3 months after the procedure for the tubes to become blocked. You must use another form of birth control for these 3 months.  Female sterilization. This is when the female has the tubes that carry sperm tied off (vasectomy).This blocks sperm from entering the vagina during sexual intercourse. After the procedure, the man can still ejaculate fluid (semen). NATURAL PLANNING METHODS  Natural family planning. This is not having sexual intercourse or using a barrier method (condom, diaphragm, cervical cap) on days the woman could become pregnant.  Calendar method. This is keeping track of the length of each menstrual cycle and identifying when you are fertile.  Ovulation method. This is avoiding sexual intercourse during ovulation.  Symptothermal method. This is avoiding sexual intercourse during ovulation, using a thermometer and ovulation symptoms.  Post-ovulation method. This is timing sexual intercourse after you have ovulated. Regardless of which type or method of contraception you choose, it is important that you use condoms to protect against the transmission of sexually transmitted infections (STIs). Talk with your health care provider about which form of contraception is most appropriate for you.   This information is not intended to replace advice given to you by your health care provider. Make sure you discuss any questions you  have with your health care provider.   Document Released: 07/19/2005 Document Revised: 07/24/2013 Document Reviewed: 01/11/2013 Elsevier Interactive Patient Education [employer]

## 2016-02-17 NOTE — Progress Notes (Signed)
1 hr gtt/labs today Tdap today

## 2016-02-18 LAB — HIV ANTIBODY (ROUTINE TESTING W REFLEX): HIV 1&2 Ab, 4th Generation: NONREACTIVE

## 2016-02-18 LAB — RPR

## 2016-02-18 LAB — GLUCOSE TOLERANCE, 1 HOUR (50G) W/O FASTING: Glucose, 1 Hr, gestational: 106 mg/dL (ref <140)

## 2016-02-19 ENCOUNTER — Encounter (HOSPITAL_COMMUNITY): Payer: Self-pay | Admitting: Advanced Practice Midwife

## 2016-02-23 ENCOUNTER — Ambulatory Visit (HOSPITAL_COMMUNITY): Payer: Self-pay

## 2016-02-25 ENCOUNTER — Encounter (HOSPITAL_COMMUNITY): Payer: Self-pay

## 2016-02-26 ENCOUNTER — Other Ambulatory Visit: Payer: Self-pay | Admitting: Advanced Practice Midwife

## 2016-02-26 ENCOUNTER — Ambulatory Visit (HOSPITAL_COMMUNITY)
Admission: RE | Admit: 2016-02-26 | Discharge: 2016-02-26 | Disposition: A | Discharge status: Home / Self Care | Payer: Self-pay | Source: Ambulatory Visit | Attending: Advanced Practice Midwife | Admitting: Advanced Practice Midwife

## 2016-02-26 ENCOUNTER — Encounter (HOSPITAL_COMMUNITY): Payer: Self-pay

## 2016-02-26 VITALS — BP 108/59 | HR 81 | Wt 145.1 lb

## 2016-02-26 DIAGNOSIS — O09529 Supervision of elderly multigravida, unspecified trimester: Secondary | ICD-10-CM

## 2016-02-26 DIAGNOSIS — Z315 Encounter for genetic counseling: Secondary | ICD-10-CM | POA: Insufficient documentation

## 2016-02-26 DIAGNOSIS — O09522 Supervision of elderly multigravida, second trimester: Secondary | ICD-10-CM

## 2016-02-26 DIAGNOSIS — Z3492 Encounter for supervision of normal pregnancy, unspecified, second trimester: Secondary | ICD-10-CM

## 2016-02-26 DIAGNOSIS — Z3480 Encounter for supervision of other normal pregnancy, unspecified trimester: Secondary | ICD-10-CM

## 2016-02-26 DIAGNOSIS — Z364 Encounter for antenatal screening for fetal growth retardation: Secondary | ICD-10-CM

## 2016-02-26 DIAGNOSIS — Z3A27 27 weeks gestation of pregnancy: Secondary | ICD-10-CM

## 2016-02-26 NOTE — Progress Notes (Signed)
Genetic Counseling  High-Risk Gestation Note  Appointment Date:  02/26/2016 Referred By: Jade Horn, CNM Date of Birth:  Jul 08, 1976   Pregnancy History: [id number] Estimated Date of Delivery: 05/25/16 Estimated Gestational Age: [redacted]w[redacted]d Attending: Eulis Foster, MD  [name] was seen for genetic counseling because of a maternal age of 40 y.o.. English/Spanish interpreter, Jade Horn, provided interpretation for today's visit.     In summary:  Discussed AMA and associated risk for fetal aneuploidy  Discussed options for screening  NIPS- declined  Ultrasound- performed today, within normal range, results reported separately  Discussed diagnostic testing options  Amniocentesis-declined  Reviewed family history concerns  Discussed carrier screening options  CF-declined  SMA-declined  Hemoglobinopathies-within normal range  She was counseled regarding maternal age and the association with risk for chromosome conditions due to nondisjunction with aging of the ova.  We reviewed chromosomes, nondisjunction, and the associated 1 in 63 risk for fetal aneuploidy related to a maternal age of 40 y.o. at [redacted]w[redacted]d gestation.  She was counseled that the risk for aneuploidy decreases as gestational age increases, accounting for those pregnancies which spontaneously abort.  We specifically discussed Down syndrome (trisomy 67), trisomies 10 and 53, and sex chromosome aneuploidies (47,XXX and 47,XXY) including the common features and prognoses of each.   We reviewed available screening options including noninvasive prenatal screening (NIPS)/cell free DNA (cfDNA) screening and detailed ultrasound.  She was counseled that screening tests are used to modify a patient's a priori risk for aneuploidy, typically based on age. This estimate provides a pregnancy specific risk assessment. We reviewed the benefits and limitations of each option. Specifically, we discussed the conditions for which  each test screens, the detection rates, and false positive rates of each. She was also counseled regarding diagnostic testing via amniocentesis. We reviewed the approximate 1 in 300-500 risk for complications from amniocentesis, including spontaneous preterm labor and delivery. We discussed the possible results that the tests might provide including: positive, negative, unanticipated, and no result. Finally, they were counseled regarding the cost of each option and potential out of pocket expenses. After consideration of all the options, she declined NIPS and amniocentesis.    A complete ultrasound was performed today. The ultrasound report will be sent under separate cover. There were no visualized fetal anomalies or markers suggestive of aneuploidy. She understands that screening tests, including ultrasound, cannot rule out all birth defects or genetic syndromes. The patient was advised of this limitation and states she still does not want additional testing at this time.   [name] was provided with written information regarding cystic fibrosis (CF), spinal muscular atrophy (SMA) and hemoglobinopathies including the carrier frequency, availability of carrier screening and prenatal diagnosis if indicated.  In addition, we discussed that CF and hemoglobinopathies are routinely screened for as part of the  newborn screening panel.  After further discussion, she declined screening for CF and SMA. Hemoglobinopathy screening was previously performed and within normal limits.  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 She is otherwise healthy, and no additional relatives were reported with limb differences. We discussed that there can be various causes for limb differences including isolated, sporadic, teratogenic, multifactorial, and  genetic. Given the reported family history, recurrence risk would likely be overall low. However, without further information regarding the provided family history, an accurate genetic risk cannot be calculated. Further genetic  counseling is warranted if more information is obtained.  The father of the pregnancy is reportedly 39 years old. Advanced paternal age (APA) is defined as paternal age greater than or equal to age 53.  Recent large-scale sequencing studies have shown that approximately 80% of de novo point mutations are of paternal origin.  Many studies have demonstrated a strong correlation between increased paternal age and de novo point mutations.  Although no specific data is available regarding fetal risks for fathers 40+ years old at conception, it is apparent that the overall risk for single gene conditions is increased.  To estimate the relative increase in risk of a genetic disorder with APA, the heritability of the disease must be considered.  Assuming an approximate 2x increase in risk for conditions that are exclusively paternal in origin, the risk for each individual condition is still relatively low.  It is estimated that the overall chance for a de novo mutation is ~0.5%.  There is a wide range of conditions which can be caused by new dominant gene mutations (achondroplasia, neurofibromatosis, Marfan syndrome etc.). Genetic testing for each individual single gene condition is not warranted or available unless ultrasound or family history concerns lend suspicion to a specific condition.    [name] denied exposure to environmental toxins or chemical agents. She denied the use of alcohol, tobacco or street drugs. She denied significant viral illnesses during the course of her pregnancy. Her medical and surgical histories were noncontributory.   I counseled [name] regarding the above risks and available options.  The approximate face-to-face time  with the genetic counselor was 45 minutes.  Quinn Plowman, MS,  Certified [employer] 02/26/2016

## 2016-02-27 ENCOUNTER — Other Ambulatory Visit (HOSPITAL_COMMUNITY): Payer: Self-pay

## 2016-02-27 DIAGNOSIS — O09529 Supervision of elderly multigravida, unspecified trimester: Secondary | ICD-10-CM

## 2016-03-04 ENCOUNTER — Encounter: Payer: Self-pay | Admitting: Medical

## 2016-03-19 ENCOUNTER — Ambulatory Visit (INDEPENDENT_AMBULATORY_CARE_PROVIDER_SITE_OTHER): Payer: Self-pay | Admitting: Family Medicine

## 2016-03-19 VITALS — BP 114/49 | HR 89 | Wt 145.7 lb

## 2016-03-19 DIAGNOSIS — O09523 Supervision of elderly multigravida, third trimester: Secondary | ICD-10-CM

## 2016-03-19 DIAGNOSIS — Z3483 Encounter for supervision of other normal pregnancy, third trimester: Secondary | ICD-10-CM

## 2016-03-19 LAB — POCT URINALYSIS DIP (DEVICE)
BILIRUBIN URINE: NEGATIVE
Glucose, UA: NEGATIVE mg/dL
Hgb urine dipstick: NEGATIVE
NITRITE: NEGATIVE
PH: 7 (ref 5.0–8.0)
PROTEIN: 30 mg/dL — AB
Specific Gravity, Urine: 1.02 (ref 1.005–1.030)
UROBILINOGEN UA: 0.2 mg/dL (ref 0.0–1.0)

## 2016-03-19 NOTE — Progress Notes (Signed)
Subjective:  Jade Horn is a 40 y.o. G3P2002 at 1142w3d being seen today for ongoing prenatal care.  She is currently monitored for the following issues for this high-risk pregnancy and has Headache(784.0); Polio osteopathy of lower leg (HCC); Supervision of normal pregnancy, antepartum; AMA (advanced maternal age) multigravida 35+; and Language barrier on her problem list.  Patient reports no complaints.  Contractions: Not present. Vag. Bleeding: None.  Movement: Present. Denies leaking of fluid.   The following portions of the patient's history were reviewed and updated as appropriate: allergies, current medications, past family history, past medical history, past social history, past surgical history and problem list. Problem list updated.  Objective:   Vitals:   03/19/16 0840  BP: (!) 114/49  Pulse: 89  Weight: 145 lb 11.2 oz (66.1 kg)    Fetal Status: Fetal Heart Rate (bpm): 145   Movement: Present     General:  Alert, oriented and cooperative. Patient is in no acute distress.  Skin: Skin is warm and dry. No rash noted.   Cardiovascular: Normal heart rate noted  Respiratory: Normal respiratory effort, no problems with respiration noted  Abdomen: Soft, gravid, appropriate for gestational age. Pain/Pressure: Present     Pelvic:  Cervical exam deferred        Extremities: Normal range of motion.     Mental Status: Normal mood and affect. Normal behavior. Normal judgment and thought content.   Urinalysis:      Assessment and Plan:  Pregnancy: G3P2002 at 542w3d  1. Supervision of normal pregnancy, antepartum, third trimester FHT and FH normal.  Will watch fundal height.  2. AMA (advanced maternal age) multigravida 35+, third trimester Scheduled for US on 8/24.  Testing starting 36 weeks    Preterm labor symptoms and general obstetric precautions including but not limited to vaginal bleeding, contractions, leaking of fluid and fetal movement were reviewed in detail  with the patient. Please refer to After Visit Summary for other counseling recommendations.  No Follow-up on file.   Jade HeritageJacob J Stinson, DO

## 2016-03-25 ENCOUNTER — Ambulatory Visit (HOSPITAL_COMMUNITY)
Admission: RE | Admit: 2016-03-25 | Discharge: 2016-03-25 | Disposition: A | Discharge status: Home / Self Care | Payer: Self-pay | Source: Ambulatory Visit | Attending: Advanced Practice Midwife | Admitting: Advanced Practice Midwife

## 2016-03-25 ENCOUNTER — Encounter (HOSPITAL_COMMUNITY): Payer: Self-pay

## 2016-03-25 ENCOUNTER — Other Ambulatory Visit (HOSPITAL_COMMUNITY): Payer: Self-pay | Admitting: Obstetrics and Gynecology

## 2016-03-25 DIAGNOSIS — O09529 Supervision of elderly multigravida, unspecified trimester: Secondary | ICD-10-CM

## 2016-03-25 DIAGNOSIS — O09523 Supervision of elderly multigravida, third trimester: Secondary | ICD-10-CM | POA: Insufficient documentation

## 2016-03-25 DIAGNOSIS — Z3A31 31 weeks gestation of pregnancy: Secondary | ICD-10-CM

## 2016-04-07 ENCOUNTER — Encounter: Payer: Self-pay | Admitting: Family

## 2016-04-07 ENCOUNTER — Ambulatory Visit (INDEPENDENT_AMBULATORY_CARE_PROVIDER_SITE_OTHER): Payer: Self-pay | Admitting: Student

## 2016-04-07 VITALS — BP 110/94 | HR 88 | Wt 149.7 lb

## 2016-04-07 DIAGNOSIS — Z789 Other specified health status: Secondary | ICD-10-CM

## 2016-04-07 DIAGNOSIS — N898 Other specified noninflammatory disorders of vagina: Secondary | ICD-10-CM

## 2016-04-07 DIAGNOSIS — Z3483 Encounter for supervision of other normal pregnancy, third trimester: Secondary | ICD-10-CM

## 2016-04-07 LAB — POCT URINALYSIS DIP (DEVICE)
BILIRUBIN URINE: NEGATIVE
Glucose, UA: NEGATIVE mg/dL
HGB URINE DIPSTICK: NEGATIVE
Ketones, ur: NEGATIVE mg/dL
NITRITE: NEGATIVE
Protein, ur: NEGATIVE mg/dL
Specific Gravity, Urine: 1.015 (ref 1.005–1.030)
UROBILINOGEN UA: 0.2 mg/dL (ref 0.0–1.0)
pH: 6.5 (ref 5.0–8.0)

## 2016-04-07 NOTE — Progress Notes (Signed)
Patient ID: Jade Horn, female   DOB: 04/10/1976, 40 y.o.   MRN: [id number]   PRENATAL VISIT NOTE  Subjective:  Jade Horn is a 40 y.o. G3P2002 at 638w1d being seen today for ongoing prenatal care.  She is currently monitored for the following issues for this high-risk pregnancy and has Headache(784.0); Polio osteopathy of lower leg (HCC); Supervision of normal pregnancy, antepartum; AMA (advanced maternal age) multigravida 35+; and Language barrier on her problem list.  Patient reports vaginal irritation.  Contractions: Not present. Vag. Bleeding: None.  Movement: Present. Denies leaking of fluid.   The following portions of the patient's history were reviewed and updated as appropriate: allergies, current medications, past family history, past medical history, past social history, past surgical history and problem list. Problem list updated.  Objective:   Vitals:   04/07/16 0908  BP: (!) 110/94  Pulse: 88  Weight: 149 lb 11.2 oz (67.9 kg)    Fetal Status: Fetal Heart Rate (bpm): 146 Fundal Height: 35 cm Movement: Present     General:  Alert, oriented and cooperative. Patient is in no acute distress.  Skin: Skin is warm and dry. No rash noted.   Cardiovascular: Normal heart rate noted  Respiratory: Normal respiratory effort, no problems with respiration noted  Abdomen: Soft, gravid, appropriate for gestational age. Pain/Pressure: Present     Pelvic:  Cervical exam deferred        Extremities: Normal range of motion.  Edema: Trace  Mental Status: Normal mood and affect. Normal behavior. Normal judgment and thought content.   Urinalysis:      Assessment and Plan:  Pregnancy: G3P2002 at 5838w1d  1. Supervision of normal pregnancy, antepartum, third trimester   2. Language barrier -video interpreter used for Spanish  3. Vaginal irritation  - Wet prep, genital   Preterm labor symptoms and general obstetric precautions including but not limited to  vaginal bleeding, contractions, leaking of fluid and fetal movement were reviewed in detail with the patient. Please refer to After Visit Summary for other counseling recommendations.  Return in about 2 weeks (around 04/21/2016) for Routine OB.  Jade HornErin Koehn Salehi, NP

## 2016-04-07 NOTE — Patient Instructions (Signed)
Tercer trimestre de embarazo (Third Trimester of Pregnancy) El tercer trimestre comprende desde la semana29 hasta la semana42, es decir, desde el mes7 hasta el mes9. El tercer trimestre es un perodo en el que el feto crece rpidamente. Hacia el final del noveno mes, el feto mide alrededor de 20pulgadas (45cm) de largo y pesa entre 6 y 10 libras (2,700 y 4,500kg).  CAMBIOS EN EL ORGANISMO Su organismo atraviesa por muchos cambios durante el embarazo, y estos varan de una mujer a otra.   Seguir aumentando de peso. Es de esperar que aumente entre 25 y 35libras (11 y 16kg) hacia el final del embarazo.  Podrn aparecer las primeras estras en las caderas, el abdomen y las mamas.  Puede tener necesidad de orinar con ms frecuencia porque el feto baja hacia la pelvis y ejerce presin sobre la vejiga.  Debido al embarazo podr sentir acidez estomacal con frecuencia.  Puede estar estreida, ya que ciertas hormonas enlentecen los movimientos de los msculos que empujan los desechos a travs de los intestinos.  Pueden aparecer hemorroides o abultarse e hincharse las venas (venas varicosas).  Puede sentir dolor plvico debido al aumento de peso y a que las hormonas del embarazo relajan las articulaciones entre los huesos de la pelvis. El dolor de espalda puede ser consecuencia de la sobrecarga de los msculos que soportan la postura.  Tal vez haya cambios en el cabello que pueden incluir su engrosamiento, crecimiento rpido y cambios en la textura. Adems, a algunas mujeres se les cae el cabello durante o despus del embarazo, o tienen el cabello seco o fino. Lo ms probable es que el cabello se le normalice despus del nacimiento del beb.  Las mamas seguirn creciendo y le dolern. A veces, puede haber una secrecin amarilla de las mamas llamada calostro.  El ombligo puede salir hacia afuera.  Puede sentir que le falta el aire debido a que se expande el tero.  Puede notar que el feto  "baja" o lo siente ms bajo, en el abdomen.  Puede tener una prdida de secrecin mucosa con sangre. Esto suele ocurrir en el trmino de unos pocos das a una semana antes de que comience el trabajo de parto.  El cuello del tero se vuelve delgado y blando (se borra) cerca de la fecha de parto. QU DEBE ESPERAR EN LOS EXMENES PRENATALES  Le harn exmenes prenatales cada 2semanas hasta la semana36. A partir de ese momento le harn exmenes semanales. Durante una visita prenatal de rutina:  La pesarn para asegurarse de que usted y el feto estn creciendo normalmente.  Le tomarn la presin arterial.  Le medirn el abdomen para controlar el desarrollo del beb.  Se escucharn los latidos cardacos fetales.  Se evaluarn los resultados de los estudios solicitados en visitas anteriores.  Le revisarn el cuello del tero cuando est prxima la fecha de parto para controlar si este se ha borrado. Alrededor de la semana36, el mdico le revisar el cuello del tero. Al mismo tiempo, realizar un anlisis de las secreciones del tejido vaginal. Este examen es para determinar si hay un tipo de bacteria, estreptococo Grupo B. El mdico le explicar esto con ms detalle. El mdico puede preguntarle lo siguiente:  Cmo le gustara que fuera el parto.  Cmo se siente.  Si siente los movimientos del beb.  Si ha tenido sntomas anormales, como prdida de lquido, sangrado, dolores de cabeza intensos o clicos abdominales.  Si est consumiendo algn producto que contenga tabaco, como cigarrillos, tabaco   de mascar y cigarrillos electrnicos.  Si tiene alguna pregunta. Otros exmenes o estudios de deteccin que pueden realizarse durante el tercer trimestre incluyen lo siguiente:  Anlisis de sangre para controlar los niveles de hierro (anemia).  Controles fetales para determinar su salud, nivel de actividad y crecimiento. Si tiene alguna enfermedad o hay problemas durante el embarazo, le harn  estudios.  Prueba del VIH (virus de inmunodeficiencia humana). Si corre un riesgo alto, pueden realizarle una prueba de deteccin del VIH durante el tercer trimestre del embarazo. FALSO TRABAJO DE PARTO Es posible que sienta contracciones leves e irregulares que finalmente desaparecen. Se llaman contracciones de Braxton Hicks o falso trabajo de parto. Las contracciones pueden durar horas, das o incluso semanas, antes de que el verdadero trabajo de parto se inicie. Si las contracciones ocurren a intervalos regulares, se intensifican o se hacen dolorosas, lo mejor es que la revise el mdico.  SIGNOS DE TRABAJO DE PARTO   Clicos de tipo menstrual.  Contracciones cada 5minutos o menos.  Contracciones que comienzan en la parte superior del tero y se extienden hacia abajo, a la zona inferior del abdomen y la espalda.  Sensacin de mayor presin en la pelvis o dolor de espalda.  Una secrecin de mucosidad acuosa o con sangre que sale de la vagina. Si tiene alguno de estos signos antes de la semana37 del embarazo, llame a su mdico de inmediato. Debe concurrir al hospital para que la controlen inmediatamente. INSTRUCCIONES PARA EL CUIDADO EN EL HOGAR   Evite fumar, consumir hierbas, beber alcohol y tomar frmacos que no le hayan recetado. Estas sustancias qumicas afectan la formacin y el desarrollo del beb.  No consuma ningn producto que contenga tabaco, lo que incluye cigarrillos, tabaco de mascar y cigarrillos electrnicos. Si necesita ayuda para dejar de fumar, consulte al mdico. Puede recibir asesoramiento y otro tipo de recursos para dejar de fumar.  Siga las indicaciones del mdico en relacin con el uso de medicamentos. Durante el embarazo, hay medicamentos que son seguros de tomar y otros que no.  Haga ejercicio solamente como se lo haya indicado el mdico. Sentir clicos uterinos es un buen signo para detener la actividad fsica.  Contine comiendo alimentos sanos con  regularidad.  Use un sostn que le brinde buen soporte si le duelen las mamas.  No se d baos de inmersin en agua caliente, baos turcos ni saunas.  Use el cinturn de seguridad en todo momento mientras conduce.  No coma carne cruda ni queso sin cocinar; evite el contacto con las bandejas sanitarias de los gatos y la tierra que estos animales usan. Estos elementos contienen grmenes que pueden causar defectos congnitos en el beb.  Tome las vitaminas prenatales.  Tome entre 1500 y 2000mg de calcio diariamente comenzando en la semana20 del embarazo hasta el parto.  Si est estreida, pruebe un laxante suave (si el mdico lo autoriza). Consuma ms alimentos ricos en fibra, como vegetales y frutas frescos y cereales integrales. Beba gran cantidad de lquido para mantener la orina de tono claro o color amarillo plido.  Dese baos de asiento con agua tibia para aliviar el dolor o las molestias causadas por las hemorroides. Use una crema para las hemorroides si el mdico la autoriza.  Si tiene venas varicosas, use medias de descanso. Eleve los pies durante 15minutos, 3 o 4veces por da. Limite el consumo de sal en su dieta.  Evite levantar objetos pesados, use zapatos de tacones bajos y mantenga una buena postura.  Descanse   con las piernas elevadas si tiene calambres o dolor de cintura.  Visite a su dentista si no lo ha hecho durante el embarazo. Use un cepillo de dientes blando para higienizarse los dientes y psese el hilo dental con suavidad.  Puede seguir manteniendo relaciones sexuales, a menos que el mdico le indique lo contrario.  No haga viajes largos excepto que sea absolutamente necesario y solo con la autorizacin del mdico.  Tome clases prenatales para entender, practicar y hacer preguntas sobre el trabajo de parto y el parto.  Haga un ensayo de la partida al hospital.  Prepare el bolso que llevar al hospital.  Prepare la habitacin del beb.  Concurra a todas  las visitas prenatales segn las indicaciones de su mdico. SOLICITE ATENCIN MDICA SI:  No est segura de que est en trabajo de parto o de que ha roto la bolsa de las aguas.  Tiene mareos.  Siente clicos leves, presin en la pelvis o dolor persistente en el abdomen.  Tiene nuseas, vmitos o diarrea persistentes.  Observa una secrecin vaginal con mal olor.  Siente dolor al orinar. SOLICITE ATENCIN MDICA DE INMEDIATO SI:   Tiene fiebre.  Tiene una prdida de lquido por la vagina.  Tiene sangrado o pequeas prdidas vaginales.  Siente dolor intenso o clicos en el abdomen.  Sube o baja de peso rpidamente.  Tiene dificultad para respirar y siente dolor de pecho.  Sbitamente se le hinchan mucho el rostro, las manos, los tobillos, los pies o las piernas.  No ha sentido los movimientos del beb durante una hora.  Siente un dolor de cabeza intenso que no se alivia con medicamentos.  Su visin se modifica.   Esta informacin no tiene como fin reemplazar el consejo del mdico. Asegrese de hacerle al mdico cualquier pregunta que tenga.   Document Released: 04/28/2005 Document Revised: 08/09/2014 Elsevier Interactive Patient Education 2016 [employer]  

## 2016-04-08 ENCOUNTER — Other Ambulatory Visit: Payer: Self-pay | Admitting: Student

## 2016-04-08 DIAGNOSIS — N760 Acute vaginitis: Principal | ICD-10-CM

## 2016-04-08 DIAGNOSIS — B373 Candidiasis of vulva and vagina: Secondary | ICD-10-CM

## 2016-04-08 DIAGNOSIS — B3731 Acute candidiasis of vulva and vagina: Secondary | ICD-10-CM

## 2016-04-08 DIAGNOSIS — B9689 Other specified bacterial agents as the cause of diseases classified elsewhere: Secondary | ICD-10-CM

## 2016-04-08 LAB — WET PREP, GENITAL: Trich, Wet Prep: NONE SEEN

## 2016-04-08 MED ORDER — TERCONAZOLE 0.8 % VA CREA: 1.0000 | TOPICAL_CREAM | Freq: Every day | VAGINAL | 0 refills | Status: DC

## 2016-04-08 MED ORDER — METRONIDAZOLE 500 MG PO TABS: 500.0000 mg | ORAL_TABLET | Freq: Two times a day (BID) | ORAL | 0 refills | Status: DC

## 2016-04-09 ENCOUNTER — Telehealth: Payer: Self-pay | Admitting: *Deleted

## 2016-04-09 NOTE — Telephone Encounter (Signed)
-----   Message from Judeth HornErin Lawrence, NP sent at 04/08/2016  1:15 PM EDT ----- Please let patient know I sent in rx for BV & yeast.  Thanks!

## 2016-04-09 NOTE — Telephone Encounter (Signed)
Called patient using Spanish interpreter [phone]#[id number]. There was no answer. Message left stating I am calling regarding test results, please return my call at the clinic.

## 2016-04-12 NOTE — Telephone Encounter (Signed)
Called patient using Spanish interpreter [phone]#[id number]. Gave patient results of wet prep, +BV and yeast. Meds to pharmacy of record. Patient voiced understanding.

## 2016-04-22 ENCOUNTER — Other Ambulatory Visit (HOSPITAL_COMMUNITY): Payer: Self-pay | Admitting: Obstetrics and Gynecology

## 2016-04-22 ENCOUNTER — Ambulatory Visit (HOSPITAL_COMMUNITY)
Admission: RE | Admit: 2016-04-22 | Discharge: 2016-04-22 | Disposition: A | Discharge status: Home / Self Care | Payer: Self-pay | Source: Ambulatory Visit | Attending: Advanced Practice Midwife | Admitting: Advanced Practice Midwife

## 2016-04-22 ENCOUNTER — Encounter (HOSPITAL_COMMUNITY): Payer: Self-pay

## 2016-04-22 DIAGNOSIS — Z3A35 35 weeks gestation of pregnancy: Secondary | ICD-10-CM

## 2016-04-22 DIAGNOSIS — O09529 Supervision of elderly multigravida, unspecified trimester: Secondary | ICD-10-CM

## 2016-04-22 DIAGNOSIS — O09523 Supervision of elderly multigravida, third trimester: Secondary | ICD-10-CM | POA: Insufficient documentation

## 2016-04-23 ENCOUNTER — Other Ambulatory Visit (HOSPITAL_COMMUNITY): Payer: Self-pay | Admitting: *Deleted

## 2016-04-23 DIAGNOSIS — O3663X0 Maternal care for excessive fetal growth, third trimester, not applicable or unspecified: Secondary | ICD-10-CM

## 2016-04-26 ENCOUNTER — Ambulatory Visit (INDEPENDENT_AMBULATORY_CARE_PROVIDER_SITE_OTHER): Payer: Self-pay | Admitting: Family Medicine

## 2016-04-26 VITALS — BP 118/75 | HR 68 | Wt 153.4 lb

## 2016-04-26 DIAGNOSIS — Z3483 Encounter for supervision of other normal pregnancy, third trimester: Secondary | ICD-10-CM

## 2016-04-26 DIAGNOSIS — O09523 Supervision of elderly multigravida, third trimester: Secondary | ICD-10-CM

## 2016-04-26 DIAGNOSIS — Z3493 Encounter for supervision of normal pregnancy, unspecified, third trimester: Secondary | ICD-10-CM

## 2016-04-26 DIAGNOSIS — Z23 Encounter for immunization: Secondary | ICD-10-CM

## 2016-04-26 NOTE — Progress Notes (Signed)
   PRENATAL VISIT NOTE  Subjective:  Jade Horn is a 40 y.o. G3P2002 at 2971w6d being seen today for ongoing prenatal care.  She is currently monitored for the following issues for this low-risk pregnancy and has Headache(784.0); Polio osteopathy of lower leg (HCC); Supervision of normal pregnancy, antepartum; AMA (advanced maternal age) multigravida 35+; and Language barrier on her problem list.  Patient reports no complaints.  Contractions: Not present. Vag. Bleeding: None.  Movement: Present. Denies leaking of fluid.   The following portions of the patient's history were reviewed and updated as appropriate: allergies, current medications, past family history, past medical history, past social history, past surgical history and problem list. Problem list updated.  Objective:   Vitals:   04/26/16 0807  BP: 118/75  Pulse: 68  Weight: 153 lb 6.4 oz (69.6 kg)    Fetal Status: Fetal Heart Rate (bpm): 145 Fundal Height: 37 cm Movement: Present  Presentation: Vertex  General:  Alert, oriented and cooperative. Patient is in no acute distress.  Skin: Skin is warm and dry. No rash noted.   Cardiovascular: Normal heart rate noted  Respiratory: Normal respiratory effort, no problems with respiration noted  Abdomen: Soft, gravid, appropriate for gestational age. Pain/Pressure: Present     Pelvic:  Cervical exam performed Dilation: Closed Effacement (%): Thick Station: Ballotable  Extremities: Normal range of motion.  Edema: Trace  Mental Status: Normal mood and affect. Normal behavior. Normal judgment and thought content.   U/s 9/21 vtx, 6 lb 10 oz (84%), AFI 15.34 Assessment and Plan:  Pregnancy: G3P2002 at 6671w6d  1. Supervision of normal pregnancy in third trimester Continue routine prenatal care.  - Culture, beta strep (group b only) - GC[facility]Chlamydia probe amp [facility] - Flu Vaccine QUAD 36+ mos IM  2. AMA (advanced maternal age) multigravida 35+, third  trimester To begin 2x/wk testing on Thursday at 36 wks.   Preterm labor symptoms and general obstetric precautions including but not limited to vaginal bleeding, contractions, leaking of fluid and fetal movement were reviewed in detail with the patient. Please refer to After Visit Summary for other counseling recommendations.  Return in 1 week (on 05/03/2016) for OB visit and NST, NST only in 4 days.  Reva Boresanya S Pratt, MD

## 2016-04-26 NOTE — Patient Instructions (Signed)
Tercer trimestre de embarazo (Third Trimester of Pregnancy) El tercer trimestre comprende desde la semana29 hasta la semana42, es decir, desde el mes7 hasta el mes9. El tercer trimestre es un perodo en el que el feto crece rpidamente. Hacia el final del noveno mes, el feto mide alrededor de 20pulgadas (45cm) de largo y pesa entre 6 y 10 libras (2,700 y 4,500kg).  CAMBIOS EN EL ORGANISMO Su organismo atraviesa por muchos cambios durante el embarazo, y estos varan de una mujer a otra.   Seguir aumentando de peso. Es de esperar que aumente entre 25 y 35libras (11 y 16kg) hacia el final del embarazo.  Podrn aparecer las primeras estras en las caderas, el abdomen y las mamas.  Puede tener necesidad de orinar con ms frecuencia porque el feto baja hacia la pelvis y ejerce presin sobre la vejiga.  Debido al embarazo podr sentir acidez estomacal con frecuencia.  Puede estar estreida, ya que ciertas hormonas enlentecen los movimientos de los msculos que empujan los desechos a travs de los intestinos.  Pueden aparecer hemorroides o abultarse e hincharse las venas (venas varicosas).  Puede sentir dolor plvico debido al aumento de peso y a que las hormonas del embarazo relajan las articulaciones entre los huesos de la pelvis. El dolor de espalda puede ser consecuencia de la sobrecarga de los msculos que soportan la postura.  Tal vez haya cambios en el cabello que pueden incluir su engrosamiento, crecimiento rpido y cambios en la textura. Adems, a algunas mujeres se les cae el cabello durante o despus del embarazo, o tienen el cabello seco o fino. Lo ms probable es que el cabello se le normalice despus del nacimiento del beb.  Las mamas seguirn creciendo y le dolern. A veces, puede haber una secrecin amarilla de las mamas llamada calostro.  El ombligo puede salir hacia afuera.  Puede sentir que le falta el aire debido a que se expande el tero.  Puede notar que el feto  "baja" o lo siente ms bajo, en el abdomen.  Puede tener una prdida de secrecin mucosa con sangre. Esto suele ocurrir en el trmino de unos pocos das a una semana antes de que comience el trabajo de parto.  El cuello del tero se vuelve delgado y blando (se borra) cerca de la fecha de parto. QU DEBE ESPERAR EN LOS EXMENES PRENATALES  Le harn exmenes prenatales cada 2semanas hasta la semana36. A partir de ese momento le harn exmenes semanales. Durante una visita prenatal de rutina:  La pesarn para asegurarse de que usted y el feto estn creciendo normalmente.  Le tomarn la presin arterial.  Le medirn el abdomen para controlar el desarrollo del beb.  Se escucharn los latidos cardacos fetales.  Se evaluarn los resultados de los estudios solicitados en visitas anteriores.  Le revisarn el cuello del tero cuando est prxima la fecha de parto para controlar si este se ha borrado. Alrededor de la semana36, el mdico le revisar el cuello del tero. Al mismo tiempo, realizar un anlisis de las secreciones del tejido vaginal. Este examen es para determinar si hay un tipo de bacteria, estreptococo Grupo B. El mdico le explicar esto con ms detalle. El mdico puede preguntarle lo siguiente:  Cmo le gustara que fuera el parto.  Cmo se siente.  Si siente los movimientos del beb.  Si ha tenido sntomas anormales, como prdida de lquido, sangrado, dolores de cabeza intensos o clicos abdominales.  Si est consumiendo algn producto que contenga tabaco, como cigarrillos, tabaco   de mascar y cigarrillos electrnicos.  Si tiene alguna pregunta. Otros exmenes o estudios de deteccin que pueden realizarse durante el tercer trimestre incluyen lo siguiente:  Anlisis de sangre para controlar los niveles de hierro (anemia).  Controles fetales para determinar su salud, nivel de actividad y crecimiento. Si tiene alguna enfermedad o hay problemas durante el embarazo, le harn  estudios.  Prueba del VIH (virus de inmunodeficiencia humana). Si corre un riesgo alto, pueden realizarle una prueba de deteccin del VIH durante el tercer trimestre del embarazo. FALSO TRABAJO DE PARTO Es posible que sienta contracciones leves e irregulares que finalmente desaparecen. Se llaman contracciones de Braxton Hicks o falso trabajo de parto. Las contracciones pueden durar horas, das o incluso semanas, antes de que el verdadero trabajo de parto se inicie. Si las contracciones ocurren a intervalos regulares, se intensifican o se hacen dolorosas, lo mejor es que la revise el mdico.  SIGNOS DE TRABAJO DE PARTO   Clicos de tipo menstrual.  Contracciones cada 5minutos o menos.  Contracciones que comienzan en la parte superior del tero y se extienden hacia abajo, a la zona inferior del abdomen y la espalda.  Sensacin de mayor presin en la pelvis o dolor de espalda.  Una secrecin de mucosidad acuosa o con sangre que sale de la vagina. Si tiene alguno de estos signos antes de la semana37 del embarazo, llame a su mdico de inmediato. Debe concurrir al hospital para que la controlen inmediatamente. INSTRUCCIONES PARA EL CUIDADO EN EL HOGAR   Evite fumar, consumir hierbas, beber alcohol y tomar frmacos que no le hayan recetado. Estas sustancias qumicas afectan la formacin y el desarrollo del beb.  No consuma ningn producto que contenga tabaco, lo que incluye cigarrillos, tabaco de mascar y cigarrillos electrnicos. Si necesita ayuda para dejar de fumar, consulte al mdico. Puede recibir asesoramiento y otro tipo de recursos para dejar de fumar.  Siga las indicaciones del mdico en relacin con el uso de medicamentos. Durante el embarazo, hay medicamentos que son seguros de tomar y otros que no.  Haga ejercicio solamente como se lo haya indicado el mdico. Sentir clicos uterinos es un buen signo para detener la actividad fsica.  Contine comiendo alimentos sanos con  regularidad.  Use un sostn que le brinde buen soporte si le duelen las mamas.  No se d baos de inmersin en agua caliente, baos turcos ni saunas.  Use el cinturn de seguridad en todo momento mientras conduce.  No coma carne cruda ni queso sin cocinar; evite el contacto con las bandejas sanitarias de los gatos y la tierra que estos animales usan. Estos elementos contienen grmenes que pueden causar defectos congnitos en el beb.  Tome las vitaminas prenatales.  Tome entre 1500 y 2000mg de calcio diariamente comenzando en la semana20 del embarazo hasta el parto.  Si est estreida, pruebe un laxante suave (si el mdico lo autoriza). Consuma ms alimentos ricos en fibra, como vegetales y frutas frescos y cereales integrales. Beba gran cantidad de lquido para mantener la orina de tono claro o color amarillo plido.  Dese baos de asiento con agua tibia para aliviar el dolor o las molestias causadas por las hemorroides. Use una crema para las hemorroides si el mdico la autoriza.  Si tiene venas varicosas, use medias de descanso. Eleve los pies durante 15minutos, 3 o 4veces por da. Limite el consumo de sal en su dieta.  Evite levantar objetos pesados, use zapatos de tacones bajos y mantenga una buena postura.  Descanse   con las piernas elevadas si tiene calambres o dolor de cintura.  Visite a su dentista si no lo ha hecho durante el embarazo. Use un cepillo de dientes blando para higienizarse los dientes y psese el hilo dental con suavidad.  Puede seguir manteniendo relaciones sexuales, a menos que el mdico le indique lo contrario.  No haga viajes largos excepto que sea absolutamente necesario y solo con la autorizacin del mdico.  Tome clases prenatales para entender, practicar y hacer preguntas sobre el trabajo de parto y el parto.  Haga un ensayo de la partida al hospital.  Prepare el bolso que llevar al hospital.  Prepare la habitacin del beb.  Concurra a todas  las visitas prenatales segn las indicaciones de su mdico. SOLICITE ATENCIN MDICA SI:  No est segura de que est en trabajo de parto o de que ha roto la bolsa de las aguas.  Tiene mareos.  Siente clicos leves, presin en la pelvis o dolor persistente en el abdomen.  Tiene nuseas, vmitos o diarrea persistentes.  Observa una secrecin vaginal con mal olor.  Siente dolor al orinar. SOLICITE ATENCIN MDICA DE INMEDIATO SI:   Tiene fiebre.  Tiene una prdida de lquido por la vagina.  Tiene sangrado o pequeas prdidas vaginales.  Siente dolor intenso o clicos en el abdomen.  Sube o baja de peso rpidamente.  Tiene dificultad para respirar y siente dolor de pecho.  Sbitamente se le hinchan mucho el rostro, las manos, los tobillos, los pies o las piernas.  No ha sentido los movimientos del beb durante una hora.  Siente un dolor de cabeza intenso que no se alivia con medicamentos.  Su visin se modifica.   Esta informacin no tiene como fin reemplazar el consejo del mdico. Asegrese de hacerle al mdico cualquier pregunta que tenga.   Document Released: 04/28/2005 Document Revised: 08/09/2014 Elsevier Interactive Patient Education 2016 Elsevier Inc.   Lactancia materna (Breastfeeding) Decidir amamantar es una de las mejores elecciones que puede hacer por usted y su beb. El cambio hormonal durante el embarazo produce el desarrollo del tejido mamario y aumenta la cantidad y el tamao de los conductos galactforos. Estas hormonas tambin permiten que las protenas, los azcares y las grasas de la sangre produzcan la leche materna en las glndulas productoras de leche. Las hormonas impiden que la leche materna sea liberada antes del nacimiento del beb, adems de impulsar el flujo de leche luego del nacimiento. Una vez que ha comenzado a amamantar, pensar en el beb, as como la succin o el llanto, pueden estimular la liberacin de leche de las glndulas productoras de  leche.  LOS BENEFICIOS DE AMAMANTAR Para el beb  La primera leche (calostro) ayuda a mejorar el funcionamiento del sistema digestivo del beb.  La leche tiene anticuerpos que ayudan a prevenir las infecciones en el beb.  El beb tiene una menor incidencia de asma, alergias y del sndrome de muerte sbita del lactante.  Los nutrientes en la leche materna son mejores para el beb que la leche maternizada y estn preparados exclusivamente para cubrir las necesidades del beb.  La leche materna mejora el desarrollo cerebral del beb.  Es menos probable que el beb desarrolle otras enfermedades, como obesidad infantil, asma o diabetes mellitus de tipo 2. Para usted   La lactancia materna favorece el desarrollo de un vnculo muy especial entre la madre y el beb.  Es conveniente. La leche materna siempre est disponible a la temperatura correcta y es econmica.  La lactancia   materna ayuda a quemar caloras y a perder el peso ganado durante el embarazo.  Favorece la contraccin del tero al tamao que tena antes del embarazo de manera ms rpida y disminuye el sangrado (loquios) despus del parto.  La lactancia materna contribuye a reducir el riesgo de desarrollar diabetes mellitus de tipo 2, osteoporosis o cncer de mama o de ovario en el futuro. SIGNOS DE QUE EL BEB EST HAMBRIENTO Primeros signos de hambre  Aumenta su estado de alerta o actividad.  Se estira.  Mueve la cabeza de un lado a otro.  Mueve la cabeza y abre la boca cuando se le toca la mejilla o la comisura de la boca (reflejo de bsqueda).  Aumenta las vocalizaciones, tales como sonidos de succin, se relame los labios, emite arrullos, suspiros, o chirridos.  Mueve la mano hacia la boca.  Se chupa con ganas los dedos o las manos. Signos tardos de hambre  Est agitado.  Llora de manera intermitente. Signos de hambre extrema Los signos de hambre extrema requerirn que lo calme y lo consuele antes de que el  beb pueda alimentarse adecuadamente. No espere a que se manifiesten los siguientes signos de hambre extrema para comenzar a amamantar:   Agitacin.  Llanto intenso y fuerte.  Gritos. INFORMACIN BSICA SOBRE LA LACTANCIA MATERNA Iniciacin de la lactancia materna  Encuentre un lugar cmodo para sentarse o acostarse, con un buen respaldo para el cuello y la espalda.  Coloque una almohada o una manta enrollada debajo del beb para acomodarlo a la altura de la mama (si est sentada). Las almohadas para amamantar se han diseado especialmente a fin de servir de apoyo para los brazos y el beb mientras amamanta.  Asegrese de que el abdomen del beb est frente al suyo.   Masajee suavemente la mama. Con las yemas de los dedos, masajee la pared del pecho hacia el pezn en un movimiento circular. Esto estimula el flujo de leche. Es posible que deba continuar este movimiento mientras amamanta si la leche fluye lentamente.  Sostenga la mama con el pulgar por arriba del pezn y los otros 4 dedos por debajo de la mama. Asegrese de que los dedos se encuentren lejos del pezn y de la boca del beb.  Empuje suavemente los labios del beb con el pezn o con el dedo.  Cuando la boca del beb se abra lo suficiente, acrquelo rpidamente a la mama e introduzca todo el pezn y la zona oscura que lo rodea (areola), tanto como sea posible, dentro de la boca del beb.  Debe haber ms areola visible por arriba del labio superior del beb que por debajo del labio inferior.  La lengua del beb debe estar entre la enca inferior y la mama.  Asegrese de que la boca del beb est en la posicin correcta alrededor del pezn (prendida). Los labios del beb deben crear un sello sobre la mama y estar doblados hacia afuera (invertidos).  Es comn que el beb succione durante 2 a 3 minutos para que comience el flujo de leche materna. Cmo debe prenderse Es muy importante que le ensee al beb cmo prenderse  adecuadamente a la mama. Si el beb no se prende adecuadamente, puede causarle dolor en el pezn y reducir la produccin de leche materna, y hacer que el beb tenga un escaso aumento de peso. Adems, si el beb no se prende adecuadamente al pezn, puede tragar aire durante la alimentacin. Esto puede causarle molestias al beb. Hacer eructar al beb al   cambiar de mama puede ayudarlo a liberar el aire. Sin embargo, ensearle al beb cmo prenderse a la mama adecuadamente es la mejor manera de evitar que se sienta molesto por tragar aire mientras se alimenta. Signos de que el beb se ha prendido adecuadamente al pezn:   Tironea o succiona de modo silencioso, sin causarle dolor.  Se escucha que traga cada 3 o 4 succiones.  Hay movimientos musculares por arriba y por delante de sus odos al succionar. Signos de que el beb no se ha prendido adecuadamente al pezn:   Hace ruidos de succin o de chasquido mientras se alimenta.  Siente dolor en el pezn. Si cree que el beb no se prendi correctamente, deslice el dedo en la comisura de la boca y colquelo entre las encas del beb para interrumpir la succin. Intente comenzar a amamantar nuevamente. Signos de lactancia materna exitosa Signos del beb:   Disminuye gradualmente el nmero de succiones o cesa la succin por completo.  Se duerme.  Relaja el cuerpo.  Retiene una pequea cantidad de leche en la boca.  Se desprende solo del pecho. Signos que presenta usted:  Las mamas han aumentado la firmeza, el peso y el tamao 1 a 3 horas despus de amamantar.  Estn ms blandas inmediatamente despus de amamantar.  Un aumento del volumen de leche, y tambin un cambio en su consistencia y color se producen hacia el quinto da de lactancia materna.  Los pezones no duelen, ni estn agrietados ni sangran. Signos de que su beb recibe la cantidad de leche suficiente  Moja al menos 3 paales en 24 horas. La orina debe ser clara y de color  amarillo plido a los 5 das de vida.  Defeca al menos 3 veces en 24 horas a los 5 das de vida. La materia fecal debe ser blanda y amarillenta.  Defeca al menos 3 veces en 24 horas a los 7 das de vida. La materia fecal debe ser grumosa y amarillenta.  No registra una prdida de peso mayor del 10% del peso al nacer durante los primeros 3 das de vida.  Aumenta de peso un promedio de 4 a 7onzas (113 a 198g) por semana despus de los 4 das de vida.  Aumenta de peso, diariamente, de manera uniforme a partir de los 5 das de vida, sin registrar prdida de peso despus de las 2semanas de vida. Despus de alimentarse, es posible que el beb regurgite una pequea cantidad. Esto es frecuente. FRECUENCIA Y DURACIN DE LA LACTANCIA MATERNA El amamantamiento frecuente la ayudar a producir ms leche y a prevenir problemas de dolor en los pezones e hinchazn en las mamas. Alimente al beb cuando muestre signos de hambre o si siente la necesidad de reducir la congestin de las mamas. Esto se denomina "lactancia a demanda". Evite el uso del chupete mientras trabaja para establecer la lactancia (las primeras 4 a 6 semanas despus del nacimiento del beb). Despus de este perodo, podr ofrecerle un chupete. Las investigaciones demostraron que el uso del chupete durante el primer ao de vida del beb disminuye el riesgo de desarrollar el sndrome de muerte sbita del lactante (SMSL). Permita que el nio se alimente en cada mama todo lo que desee. Contine amamantando al beb hasta que haya terminado de alimentarse. Cuando el beb se desprende o se queda dormido mientras se est alimentando de la primera mama, ofrzcale la segunda. Debido a que, con frecuencia, los recin nacidos permanecen somnolientos las primeras semanas de vida, es posible   que deba despertar al beb para alimentarlo. Los horarios de lactancia varan de un beb a otro. Sin embargo, las siguientes reglas pueden servir como gua para ayudarla a  garantizar que el beb se alimenta adecuadamente:  Se puede amamantar a los recin nacidos (bebs de 4 semanas o menos de vida) cada 1 a 3 horas.  No deben transcurrir ms de 3 horas durante el da o 5 horas durante la noche sin que se amamante a los recin nacidos.  Debe amamantar al beb 8 veces como mnimo en un perodo de 24 horas, hasta que comience a introducir slidos en su dieta, a los 6 meses de vida aproximadamente. EXTRACCIN DE LECHE MATERNA La extraccin y el almacenamiento de la leche materna le permiten asegurarse de que el beb se alimente exclusivamente de leche materna, aun en momentos en los que no puede amamantar. Esto tiene especial importancia si debe regresar al trabajo en el perodo en que an est amamantando o si no puede estar presente en los momentos en que el beb debe alimentarse. Su asesor en lactancia puede orientarla sobre cunto tiempo es seguro almacenar leche materna.  El sacaleche es un aparato que le permite extraer leche de la mama a un recipiente estril. Luego, la leche materna extrada puede almacenarse en un refrigerador o congelador. Algunos sacaleches son manuales, mientras que otros son elctricos. Consulte a su asesor en lactancia qu tipo ser ms conveniente para usted. Los sacaleches se pueden comprar; sin embargo, algunos hospitales y grupos de apoyo a la lactancia materna alquilan sacaleches mensualmente. Un asesor en lactancia puede ensearle cmo extraer leche materna manualmente, en caso de que prefiera no usar un sacaleche.  CMO CUIDAR LAS MAMAS DURANTE LA LACTANCIA MATERNA Los pezones se secan, agrietan y duelen durante la lactancia materna. Las siguientes recomendaciones pueden ayudarla a mantener las mamas humectadas y sanas:  Evite usar jabn en los pezones.  Use un sostn de soporte. Aunque no son esenciales, las camisetas sin mangas o los sostenes especiales para amamantar estn diseados para acceder fcilmente a las mamas, para amamantar  sin tener que quitarse todo el sostn o la camiseta. Evite usar sostenes con aro o sostenes muy ajustados.  Seque al aire sus pezones durante 3 a 4minutos despus de amamantar al beb.  Utilice solo apsitos de algodn en el sostn para absorber las prdidas de leche. La prdida de un poco de leche materna entre las tomas es normal.  Utilice lanolina sobre los pezones luego de amamantar. La lanolina ayuda a mantener la humedad normal de la piel. Si usa lanolina pura, no tiene que lavarse los pezones antes de volver a alimentar al beb. La lanolina pura no es txica para el beb. Adems, puede extraer manualmente algunas gotas de leche materna y masajear suavemente esa leche sobre los pezones, para que la leche se seque al aire. Durante las primeras semanas despus de dar a luz, algunas mujeres pueden experimentar hinchazn en las mamas (congestin mamaria). La congestin puede hacer que sienta las mamas pesadas, calientes y sensibles al tacto. El pico de la congestin ocurre dentro de los 3 a 5 das despus del parto. Las siguientes recomendaciones pueden ayudarla a aliviar la congestin:  Vace por completo las mamas al amamantar o extraer leche. Puede aplicar calor hmedo en las mamas (en la ducha o con toallas hmedas para manos) antes de amamantar o extraer leche. Esto aumenta la circulacin y ayuda a que la leche fluya. Si el beb no vaca por completo las   mamas cuando lo amamanta, extraiga la leche restante despus de que haya finalizado.  Use un sostn ajustado (para amamantar o comn) o una camiseta sin mangas durante 1 o 2 das para indicar al cuerpo que disminuya ligeramente la produccin de leche.  Aplique compresas de hielo sobre las mamas, a menos que le resulte demasiado incmodo.  Asegrese de que el beb est prendido y se encuentre en la posicin correcta mientras lo alimenta. Si la congestin persiste luego de 48 horas o despus de seguir estas recomendaciones, comunquese con su  mdico o un asesor en lactancia. RECOMENDACIONES GENERALES PARA EL CUIDADO DE LA SALUD DURANTE LA LACTANCIA MATERNA  Consuma alimentos saludables. Alterne comidas y colaciones, y coma 3 de cada una por da. Dado que lo que come afecta la leche materna, es posible que algunas comidas hagan que su beb se vuelva ms irritable de lo habitual. Evite comer este tipo de alimentos si percibe que afectan de manera negativa al beb.  Beba leche, jugos de fruta y agua para satisfacer su sed (aproximadamente 10 vasos al da).  Descanse con frecuencia, reljese y tome sus vitaminas prenatales para evitar la fatiga, el estrs y la anemia.  Contine con los autocontroles de la mama.  Evite masticar y fumar tabaco. Las sustancias qumicas de los cigarrillos que pasan a la leche materna y la exposicin al humo ambiental del tabaco pueden daar al beb.  No consuma alcohol ni drogas, incluida la marihuana. Algunos medicamentos, que pueden ser perjudiciales para el beb, pueden pasar a travs de la leche materna. Es importante que consulte a su mdico antes de tomar cualquier medicamento, incluidos todos los medicamentos recetados y de venta libre, as como los suplementos vitamnicos y herbales. Puede quedar embarazada durante la lactancia. Si desea controlar la natalidad, consulte a su mdico cules son las opciones ms seguras para el beb. SOLICITE ATENCIN MDICA SI:   Usted siente que quiere dejar de amamantar o se siente frustrada con la lactancia.  Siente dolor en las mamas o en los pezones.  Sus pezones estn agrietados o sangran.  Sus pechos estn irritados, sensibles o calientes.  Tiene un rea hinchada en cualquiera de las mamas.  Siente escalofros o fiebre.  Tiene nuseas o vmitos.  Presenta una secrecin de otro lquido distinto de la leche materna de los pezones.  Sus mamas no se llenan antes de amamantar al beb para el quinto da despus del parto.  Se siente triste y  deprimida.  El beb est demasiado somnoliento como para comer bien.  El beb tiene problemas para dormir.  Moja menos de 3 paales en 24 horas.  Defeca menos de 3 veces en 24 horas.  La piel del beb o la parte blanca de los ojos se vuelven amarillentas.  El beb no ha aumentado de peso a los 5 das de vida. SOLICITE ATENCIN MDICA DE INMEDIATO SI:   El beb est muy cansado (letargo) y no se quiere despertar para comer.  Le sube la fiebre sin causa.   Esta informacin no tiene como fin reemplazar el consejo del mdico. Asegrese de hacerle al mdico cualquier pregunta que tenga.   Document Released: 07/19/2005 Document Revised: 04/09/2015 Elsevier Interactive Patient Education 2016 Elsevier Inc.  

## 2016-04-27 ENCOUNTER — Encounter: Payer: Self-pay | Admitting: Family Medicine

## 2016-04-27 DIAGNOSIS — O99820 Streptococcus B carrier state complicating pregnancy: Secondary | ICD-10-CM | POA: Insufficient documentation

## 2016-04-27 LAB — CULTURE, BETA STREP (GROUP B ONLY)

## 2016-04-27 LAB — GC/CHLAMYDIA PROBE AMP (~~LOC~~) NOT AT ARMC
CHLAMYDIA, DNA PROBE: NEGATIVE
NEISSERIA GONORRHEA: NEGATIVE

## 2016-04-29 ENCOUNTER — Ambulatory Visit (INDEPENDENT_AMBULATORY_CARE_PROVIDER_SITE_OTHER): Payer: Self-pay | Admitting: Obstetrics and Gynecology

## 2016-04-29 VITALS — BP 122/72 | HR 68

## 2016-04-29 DIAGNOSIS — O09523 Supervision of elderly multigravida, third trimester: Secondary | ICD-10-CM

## 2016-04-29 DIAGNOSIS — O322XX0 Maternal care for transverse and oblique lie, not applicable or unspecified: Secondary | ICD-10-CM | POA: Insufficient documentation

## 2016-04-29 DIAGNOSIS — Z36 Encounter for antenatal screening of mother: Secondary | ICD-10-CM

## 2016-04-29 NOTE — Progress Notes (Signed)
NST Note Date: 04/29/2016 Gestational Age: 41/2 FHT: 130 baseline, +accels, no decel, mod var Toco: one UC  A/P: rNST. Oblique to transverse lie on normal AFI today. F/u on Monday and d/w pt re: version if still not vertex  Cornelia Copaharlie Addeline Calarco, Jr MD [facility]ending [facility](Faculty Practice)

## 2016-05-03 ENCOUNTER — Ambulatory Visit (INDEPENDENT_AMBULATORY_CARE_PROVIDER_SITE_OTHER): Payer: Self-pay | Admitting: Obstetrics and Gynecology

## 2016-05-03 VITALS — BP 125/66 | HR 65 | Wt 155.8 lb

## 2016-05-03 DIAGNOSIS — O09523 Supervision of elderly multigravida, third trimester: Secondary | ICD-10-CM

## 2016-05-03 DIAGNOSIS — Z789 Other specified health status: Secondary | ICD-10-CM

## 2016-05-03 DIAGNOSIS — O99820 Streptococcus B carrier state complicating pregnancy: Secondary | ICD-10-CM

## 2016-05-03 DIAGNOSIS — Z3480 Encounter for supervision of other normal pregnancy, unspecified trimester: Secondary | ICD-10-CM

## 2016-05-03 NOTE — Progress Notes (Signed)
Subjective:  Jade MarekCatarina Horn is a 40 y.o. G3P2002 at 198w6d being seen today for ongoing prenatal care.  She is currently monitored for the following issues for this low-risk pregnancy and has Polio osteopathy of lower leg (HCC); Supervision of normal pregnancy, antepartum; AMA (advanced maternal age) multigravida 35+; Language barrier; and Group B Streptococcus carrier, +RV culture, currently pregnant on her problem list.  Patient reports no complaints.  Contractions: Irregular. Vag. Bleeding: None.  Movement: Present. Denies leaking of fluid.   The following portions of the patient's history were reviewed and updated as appropriate: allergies, current medications, past family history, past medical history, past social history, past surgical history and problem list. Problem list updated.  Objective:   Vitals:   05/03/16 0755  BP: 125/66  Pulse: 65  Weight: 70.7 kg (155 lb 12.8 oz)    Fetal Status: Fetal Heart Rate (bpm): NST   Movement: Present     General:  Alert, oriented and cooperative. Patient is in no acute distress.  Skin: Skin is warm and dry. No rash noted.   Cardiovascular: Normal heart rate noted  Respiratory: Normal respiratory effort, no problems with respiration noted  Abdomen: Soft, gravid, appropriate for gestational age. Pain/Pressure: Present     Pelvic:  Cervical exam deferred        Extremities: Normal range of motion.  Edema: Trace  Mental Status: Normal mood and affect. Normal behavior. Normal judgment and thought content.   Urinalysis:      Assessment and Plan:  Pregnancy: G3P2002 at 138w6d  1. AMA (advanced maternal age) multigravida 35+, third trimester  - Fetal nonstress test, reactive today, vertex as well Continue with antenatal testing  2. Supervision of other normal pregnancy, antepartum   3. Language barrier  4. Group B Streptococcus carrier, +RV culture, currently pregnant Treat in labor  Term labor symptoms and general obstetric  precautions including but not limited to vaginal bleeding, contractions, leaking of fluid and fetal movement were reviewed in detail with the patient. Please refer to After Visit Summary for other counseling recommendations.  Return in about 1 week (around 05/10/2016) for Ob fu and NST.   Hermina StaggersMichael L Ervin, MD

## 2016-05-03 NOTE — Progress Notes (Signed)
Interpreter Carol Hernandez present for encounter.   

## 2016-05-06 ENCOUNTER — Ambulatory Visit (INDEPENDENT_AMBULATORY_CARE_PROVIDER_SITE_OTHER): Payer: Self-pay | Admitting: Obstetrics and Gynecology

## 2016-05-06 VITALS — BP 121/64 | HR 65

## 2016-05-06 DIAGNOSIS — O09523 Supervision of elderly multigravida, third trimester: Secondary | ICD-10-CM

## 2016-05-06 DIAGNOSIS — Z3689 Encounter for other specified antenatal screening: Secondary | ICD-10-CM

## 2016-05-06 NOTE — Progress Notes (Signed)
US for growth scheduled 10/12. Continue twice weekly fetal testing as scheduled

## 2016-05-06 NOTE — Progress Notes (Signed)
NST Note Date: 05/06/2016 Gestational Age: 41/2 FHT: 125 baseline, +accels, no decel, mod var Toco: q8-5366m  A/P: rNST. Continue with current plan of care.   Cornelia Copaharlie Burnette Valenti, Jr MD Attending [facility](Faculty Practice)

## 2016-05-10 ENCOUNTER — Ambulatory Visit (INDEPENDENT_AMBULATORY_CARE_PROVIDER_SITE_OTHER): Payer: Self-pay | Admitting: Obstetrics & Gynecology

## 2016-05-10 VITALS — BP 127/67 | HR 69 | Wt 154.0 lb

## 2016-05-10 DIAGNOSIS — O09523 Supervision of elderly multigravida, third trimester: Secondary | ICD-10-CM

## 2016-05-10 NOTE — Progress Notes (Signed)
Interpreter Jade Horn present for encounter.  Pt has not been able to obtain prescriptions from 9/7 for Metronidazole and Terazol cream due to language barrier.  Pharmacy contacted on behalf of pt and she will obtain medications today.

## 2016-05-10 NOTE — Progress Notes (Signed)
   PRENATAL VISIT NOTE  Subjective:  Jade Horn is a 40 y.o. G3P2002 at 5357w6d being seen today for ongoing prenatal care.  She is currently monitored for the following issues for this high-risk pregnancy and has Polio osteopathy of lower leg (HCC); Supervision of normal pregnancy, antepartum; AMA (advanced maternal age) multigravida 35+; Language barrier; and Group B Streptococcus carrier, +RV culture, currently pregnant on her problem list.  Patient reports no complaints.  Contractions: Irregular. Vag. Bleeding: None.  Movement: Present. Denies leaking of fluid.   The following portions of the patient's history were reviewed and updated as appropriate: allergies, current medications, past family history, past medical history, past social history, past surgical history and problem list. Problem list updated.  Objective:   Vitals:   05/10/16 0754  BP: 127/67  Pulse: 69  Weight: 154 lb (69.9 kg)    Fetal Status: Fetal Heart Rate (bpm): NST   Movement: Present     General:  Alert, oriented and cooperative. Patient is in no acute distress.  Skin: Skin is warm and dry. No rash noted.   Cardiovascular: Normal heart rate noted  Respiratory: Normal respiratory effort, no problems with respiration noted  Abdomen: Soft, gravid, appropriate for gestational age. Pain/Pressure: Present     Pelvic:  Cervical exam deferred        Extremities: Normal range of motion.     Mental Status: Normal mood and affect. Normal behavior. Normal judgment and thought content.   Urinalysis:      Assessment and Plan:  Pregnancy: G3P2002 at 7157w6d  1. AMA (advanced maternal age) multigravida 35+, third trimester - MFM US this week for growth _ Induction at 40 weeks - Fetal nonstress test--REACTIVE; Baby very active during NST  Term labor symptoms and general obstetric precautions including but not limited to vaginal bleeding, contractions, leaking of fluid and fetal movement were reviewed in  detail with the patient. Please refer to After Visit Summary for other counseling recommendations.  Return in about 7 days (around 05/17/2016) for Ob fu and NST.  Lesly DukesKelly H Toshie Demelo, MD

## 2016-05-13 ENCOUNTER — Encounter (HOSPITAL_COMMUNITY): Payer: Self-pay

## 2016-05-13 ENCOUNTER — Ambulatory Visit (INDEPENDENT_AMBULATORY_CARE_PROVIDER_SITE_OTHER): Payer: Self-pay | Admitting: Family Medicine

## 2016-05-13 ENCOUNTER — Ambulatory Visit (HOSPITAL_COMMUNITY)
Admission: RE | Admit: 2016-05-13 | Discharge: 2016-05-13 | Disposition: A | Discharge status: Home / Self Care | Payer: Self-pay | Source: Ambulatory Visit | Attending: Advanced Practice Midwife | Admitting: Advanced Practice Midwife

## 2016-05-13 VITALS — BP 130/70 | HR 72

## 2016-05-13 DIAGNOSIS — O09523 Supervision of elderly multigravida, third trimester: Secondary | ICD-10-CM | POA: Insufficient documentation

## 2016-05-13 DIAGNOSIS — O3663X0 Maternal care for excessive fetal growth, third trimester, not applicable or unspecified: Secondary | ICD-10-CM | POA: Insufficient documentation

## 2016-05-13 DIAGNOSIS — Z3A38 38 weeks gestation of pregnancy: Secondary | ICD-10-CM | POA: Insufficient documentation

## 2016-05-13 NOTE — Progress Notes (Signed)
NST reviewed and reactive.  

## 2016-05-13 NOTE — Progress Notes (Signed)
[place] for growth done today.  IOL scheduled 10/24

## 2016-05-17 ENCOUNTER — Inpatient Hospital Stay (HOSPITAL_COMMUNITY): Payer: Medicaid Other | Admitting: Anesthesiology

## 2016-05-17 ENCOUNTER — Encounter (HOSPITAL_COMMUNITY): Payer: Self-pay | Admitting: *Deleted

## 2016-05-17 ENCOUNTER — Inpatient Hospital Stay (HOSPITAL_COMMUNITY)
Admission: AD | Admit: 2016-05-17 | Discharge: 2016-05-18 | DRG: 775 | Disposition: A | Discharge status: Home / Self Care | Payer: Medicaid Other | Source: Ambulatory Visit | Attending: Family Medicine | Admitting: Family Medicine

## 2016-05-17 ENCOUNTER — Ambulatory Visit (INDEPENDENT_AMBULATORY_CARE_PROVIDER_SITE_OTHER): Payer: Self-pay | Admitting: Obstetrics & Gynecology

## 2016-05-17 ENCOUNTER — Encounter: Payer: Self-pay | Admitting: Obstetrics & Gynecology

## 2016-05-17 VITALS — BP 123/76 | HR 62 | Wt 154.0 lb

## 2016-05-17 DIAGNOSIS — Z3A38 38 weeks gestation of pregnancy: Secondary | ICD-10-CM

## 2016-05-17 DIAGNOSIS — Z8249 Family history of ischemic heart disease and other diseases of the circulatory system: Secondary | ICD-10-CM | POA: Diagnosis not present

## 2016-05-17 DIAGNOSIS — Z3480 Encounter for supervision of other normal pregnancy, unspecified trimester: Secondary | ICD-10-CM

## 2016-05-17 DIAGNOSIS — Z833 Family history of diabetes mellitus: Secondary | ICD-10-CM | POA: Diagnosis not present

## 2016-05-17 DIAGNOSIS — Z3483 Encounter for supervision of other normal pregnancy, third trimester: Secondary | ICD-10-CM | POA: Diagnosis present

## 2016-05-17 DIAGNOSIS — O09523 Supervision of elderly multigravida, third trimester: Secondary | ICD-10-CM

## 2016-05-17 LAB — CBC
HEMATOCRIT: 30 % — AB (ref 36.0–46.0)
HEMOGLOBIN: 9.2 g/dL — AB (ref 12.0–15.0)
MCH: 20.3 pg — AB (ref 26.0–34.0)
MCHC: 30.7 g/dL (ref 30.0–36.0)
MCV: 66.1 fL — ABNORMAL LOW (ref 78.0–100.0)
Platelets: 320 10*3/uL (ref 150–400)
RBC: 4.54 MIL/uL (ref 3.87–5.11)
RDW: 18 % — ABNORMAL HIGH (ref 11.5–15.5)
WBC: 7 10*3/uL (ref 4.0–10.5)

## 2016-05-17 LAB — TYPE AND SCREEN
ABO/RH(D): O POS
Antibody Screen: NEGATIVE

## 2016-05-17 LAB — ABO/RH: ABO/RH(D): O POS

## 2016-05-17 MED ORDER — LACTATED RINGERS IV SOLN: 500.0000 mL | Freq: Once | INTRAVENOUS | Status: DC

## 2016-05-17 MED ORDER — OXYTOCIN BOLUS FROM INFUSION: 500.0000 mL | Freq: Once | INTRAVENOUS | Status: DC

## 2016-05-17 MED ORDER — ONDANSETRON HCL 4 MG/2ML IJ SOLN: 4.0000 mg | INTRAMUSCULAR | Status: DC | PRN

## 2016-05-17 MED ORDER — ACETAMINOPHEN 325 MG PO TABS: 650.0000 mg | ORAL_TABLET | ORAL | Status: DC | PRN

## 2016-05-17 MED ORDER — PHENYLEPHRINE 40 MCG/ML (10ML) SYRINGE FOR IV PUSH (FOR BLOOD PRESSURE SUPPORT)
80.0000 ug | PREFILLED_SYRINGE | INTRAVENOUS | Status: DC | PRN
  Filled 2016-05-17: qty 5

## 2016-05-17 MED ORDER — DIBUCAINE 1 % RE OINT: 1.0000 "application " | TOPICAL_OINTMENT | RECTAL | Status: DC | PRN

## 2016-05-17 MED ORDER — ZOLPIDEM TARTRATE 5 MG PO TABS: 5.0000 mg | ORAL_TABLET | Freq: Every evening | ORAL | Status: DC | PRN

## 2016-05-17 MED ORDER — TETANUS-DIPHTH-ACELL PERTUSSIS 5-2.5-18.5 LF-MCG/0.5 IM SUSP: 0.5000 mL | Freq: Once | INTRAMUSCULAR | Status: DC

## 2016-05-17 MED ORDER — ONDANSETRON HCL 4 MG/2ML IJ SOLN: 4.0000 mg | Freq: Four times a day (QID) | INTRAMUSCULAR | Status: DC | PRN

## 2016-05-17 MED ORDER — SIMETHICONE 80 MG PO CHEW: 80.0000 mg | CHEWABLE_TABLET | ORAL | Status: DC | PRN

## 2016-05-17 MED ORDER — LIDOCAINE HCL (PF) 1 % IJ SOLN
30.0000 mL | INTRAMUSCULAR | Status: DC | PRN
  Filled 2016-05-17: qty 30

## 2016-05-17 MED ORDER — OXYCODONE-ACETAMINOPHEN 5-325 MG PO TABS: 1.0000 | ORAL_TABLET | ORAL | Status: DC | PRN

## 2016-05-17 MED ORDER — SOD CITRATE-CITRIC ACID 500-334 MG/5ML PO SOLN: 30.0000 mL | ORAL | Status: DC | PRN

## 2016-05-17 MED ORDER — LACTATED RINGERS IV SOLN
INTRAVENOUS | Status: DC
  Administered 2016-05-17 (×2): via INTRAVENOUS

## 2016-05-17 MED ORDER — EPHEDRINE 5 MG/ML INJ
10.0000 mg | INTRAVENOUS | Status: DC | PRN
  Filled 2016-05-17: qty 4

## 2016-05-17 MED ORDER — OXYCODONE-ACETAMINOPHEN 5-325 MG PO TABS
2.0000 | ORAL_TABLET | ORAL | Status: DC | PRN
Start: 2016-05-17 — End: 2016-05-17

## 2016-05-17 MED ORDER — SODIUM CHLORIDE 0.9 % IV SOLN
2.0000 g | Freq: Four times a day (QID) | INTRAVENOUS | Status: DC
  Administered 2016-05-17: 2 g via INTRAVENOUS
  Filled 2016-05-17 (×2): qty 2000

## 2016-05-17 MED ORDER — DIPHENHYDRAMINE HCL 50 MG/ML IJ SOLN: 12.5000 mg | INTRAMUSCULAR | Status: DC | PRN

## 2016-05-17 MED ORDER — COCONUT OIL OIL: 1.0000 "application " | TOPICAL_OIL | Status: DC | PRN

## 2016-05-17 MED ORDER — OXYTOCIN 40 UNITS IN LACTATED RINGERS INFUSION - SIMPLE MED
2.5000 [IU]/h | INTRAVENOUS | Status: DC
Start: 2016-05-17 — End: 2016-05-17
  Filled 2016-05-17: qty 1000

## 2016-05-17 MED ORDER — FLEET ENEMA 7-19 GM/118ML RE ENEM: 1.0000 | ENEMA | RECTAL | Status: DC | PRN

## 2016-05-17 MED ORDER — IBUPROFEN 600 MG PO TABS
600.0000 mg | ORAL_TABLET | Freq: Four times a day (QID) | ORAL | Status: DC
  Administered 2016-05-17 – 2016-05-18 (×3): 600 mg via ORAL
  Filled 2016-05-17 (×3): qty 1

## 2016-05-17 MED ORDER — DIPHENHYDRAMINE HCL 25 MG PO CAPS: 25.0000 mg | ORAL_CAPSULE | Freq: Four times a day (QID) | ORAL | Status: DC | PRN

## 2016-05-17 MED ORDER — ONDANSETRON HCL 4 MG PO TABS: 4.0000 mg | ORAL_TABLET | ORAL | Status: DC | PRN

## 2016-05-17 MED ORDER — LACTATED RINGERS IV SOLN
500.0000 mL | Freq: Once | INTRAVENOUS | Status: AC
Start: 2016-05-17 — End: 2016-05-17
  Administered 2016-05-17: 500 mL via INTRAVENOUS

## 2016-05-17 MED ORDER — PHENYLEPHRINE 40 MCG/ML (10ML) SYRINGE FOR IV PUSH (FOR BLOOD PRESSURE SUPPORT)
80.0000 ug | PREFILLED_SYRINGE | INTRAVENOUS | Status: DC | PRN
  Filled 2016-05-17: qty 5
  Filled 2016-05-17: qty 10

## 2016-05-17 MED ORDER — SENNOSIDES-DOCUSATE SODIUM 8.6-50 MG PO TABS
2.0000 | ORAL_TABLET | ORAL | Status: DC
  Administered 2016-05-17: 2 via ORAL
  Filled 2016-05-17: qty 2

## 2016-05-17 MED ORDER — LACTATED RINGERS IV SOLN
500.0000 mL | INTRAVENOUS | Status: DC | PRN
Start: 2016-05-17 — End: 2016-05-17

## 2016-05-17 MED ORDER — SODIUM CHLORIDE 0.9 % IV SOLN
2.0000 g | Freq: Once | INTRAVENOUS | Status: AC
  Administered 2016-05-17: 2 g via INTRAVENOUS
  Filled 2016-05-17: qty 2000

## 2016-05-17 MED ORDER — LIDOCAINE HCL (PF) 1 % IJ SOLN
INTRAMUSCULAR | Status: DC | PRN
  Administered 2016-05-17: 6 mL via EPIDURAL
  Administered 2016-05-17: 4 mL via EPIDURAL

## 2016-05-17 MED ORDER — WITCH HAZEL-GLYCERIN EX PADS: 1.0000 "application " | MEDICATED_PAD | CUTANEOUS | Status: DC | PRN

## 2016-05-17 MED ORDER — FENTANYL 2.5 MCG/ML BUPIVACAINE 1/10 % EPIDURAL INFUSION (WH - ANES)
14.0000 mL/h | INTRAMUSCULAR | Status: DC | PRN
  Administered 2016-05-17: 14 mL/h via EPIDURAL
  Filled 2016-05-17: qty 125

## 2016-05-17 MED ORDER — BENZOCAINE-MENTHOL 20-0.5 % EX AERO: 1.0000 "application " | INHALATION_SPRAY | CUTANEOUS | Status: DC | PRN

## 2016-05-17 MED ORDER — PRENATAL MULTIVITAMIN CH
1.0000 | ORAL_TABLET | Freq: Every day | ORAL | Status: DC
  Administered 2016-05-18: 1 via ORAL
  Filled 2016-05-17: qty 1

## 2016-05-17 NOTE — Anesthesia Pain Management Evaluation Note (Signed)
  CRNA Pain Management Visit Note  Patient: Jade MarekCatarina Horn, 40 y.o., female  "Hello I am a member of the anesthesia team at [facility]. We have an anesthesia team available at all times to provide care throughout the hospital, including epidural management and anesthesia for C-section. I don't know your plan for the delivery whether it a natural birth, water birth, IV sedation, nitrous supplementation, doula or epidural, but we want to meet your pain goals."   1.Was your pain managed to your expectations on prior hospitalizations?   Yes   2.What is your expectation for pain management during this hospitalization?     Epidural  3.How can we help you reach that goal? epidural  Record the patient's initial score and the patient's pain goal.   Pain: 2  Pain Goal: 4 [facility] you to be able to say your pain was always managed very well.  Jade Horn 05/17/2016

## 2016-05-17 NOTE — Progress Notes (Signed)
   PRENATAL VISIT NOTE  Subjective:  Jade Horn is a 40 y.o. G3P2002 at 6544w6d being seen today for ongoing prenatal care.  She is currently monitored for the following issues for this low-risk pregnancy and has Polio osteopathy of lower leg (HCC); Supervision of normal pregnancy, antepartum; AMA (advanced maternal age) multigravida 35+; Language barrier; and Group B Streptococcus carrier, +RV culture, currently pregnant on her problem list.  Patient is Spanish-speaking only, Spanish interpreter present for this encounter.  Patient reports frequent, strong contractions since last night.  Contractions: Irregular. Vag. Bleeding: None.  Movement: Present. Denies leaking of fluid.   The following portions of the patient's history were reviewed and updated as appropriate: allergies, current medications, past family history, past medical history, past social history, past surgical history and problem list. Problem list updated.  Objective:   Vitals:   05/17/16 0752  BP: 123/76  Pulse: 62  Weight: 154 lb (69.9 kg)    Fetal Status: Fetal Heart Rate (bpm): nst Fundal Height: 39 cm Movement: Present  Presentation: Vertex  General:  Alert, oriented and cooperative. Patient is in no acute distress.  Skin: Skin is warm and dry. No rash noted.   Cardiovascular: Normal heart rate noted  Respiratory: Normal respiratory effort, no problems with respiration noted  Abdomen: Soft, gravid, appropriate for gestational age. Pain/Pressure: Present     Pelvic:  Cervical exam performed Dilation: 5 Effacement (%): 70 Station: -2  Extremities: Normal range of motion.  Edema: Deep pitting, indentation remains for a short time  Mental Status: Normal mood and affect. Normal behavior. Normal judgment and thought content.   NST performed today was reviewed and was found to be reactive.  Tocometer showed q2-3 minute contractions  Assessment and Plan:  Pregnancy: G3P2002 at 444w6d  1. Indication for care  in labor and delivery, antepartum Patient is in labor. L&D on call team and charge RN notified. Will send to L&D.  2. AMA (advanced maternal age) multigravida 35+, third trimester Reactive NST.  05/13/16 EFW 4036w2d 3528g/7-12 (84%), AFI 14.6 cm, cephalic  3. Supervision of other normal pregnancy, antepartum  Please refer to After Visit Summary for other counseling recommendations.  Return in about 6 weeks (around 06/28/2016) for Postpartum check.  Tereso NewcomerUgonna A Arryn Terrones, MD

## 2016-05-17 NOTE — Anesthesia Procedure Notes (Signed)
Epidural Patient location during procedure: OB Start time: 05/17/2016 2:15 PM End time: 05/17/2016 2:19 PM  Staffing Anesthesiologist: Linton RumpALLAN, Ryan Ogborn DICKERSON Performed: anesthesiologist   Preanesthetic Checklist Completed: patient identified, surgical consent, pre-op evaluation, timeout performed, IV checked, risks and benefits discussed and monitors and equipment checked  Epidural Patient position: sitting Prep: site prepped and draped and DuraPrep Patient monitoring: continuous pulse ox and blood pressure Approach: midline Location: L2-L3 Injection technique: LOR air  Needle:  Needle type: Tuohy  Needle gauge: 17 G Needle length: 9 cm and 9 Needle insertion depth: 4.5 cm Catheter type: closed end flexible Catheter size: 19 Gauge Catheter at skin depth: 9.5 cm Test dose: negative  Assessment Events: blood not aspirated, injection not painful, no injection resistance, negative IV test and no paresthesia  Additional Notes Reason for block:procedure for pain

## 2016-05-17 NOTE — Progress Notes (Signed)
Patient ID: [name], female   DOB: 08/25/1975, 40 y.o.   MRN: [id number] Getting more active  Cervix 7cm  FHR reassuring UCs every 2 min  Anticipate SVD soon.

## 2016-05-17 NOTE — Anesthesia Preprocedure Evaluation (Signed)
Anesthesia Evaluation  Patient identified by MRN, date of birth, ID band Patient awake    Reviewed: Allergy & Precautions, NPO status , Patient's Chart, lab work & pertinent test results  History of Anesthesia Complications Negative for: history of anesthetic complications  Airway Mallampati: III  TM Distance: >3 FB Neck ROM: Full    Dental  (+) Teeth Intact   Pulmonary neg pulmonary ROS,    Pulmonary exam normal breath sounds clear to auscultation       Cardiovascular negative cardio ROS   Rhythm:Regular Rate:Normal     Neuro/Psych neg Seizures Polio osteopathy of lower leg    GI/Hepatic negative GI ROS, Neg liver ROS,   Endo/Other  negative endocrine ROS  Renal/GU negative Renal ROS     Musculoskeletal   Abdominal   Peds  Hematology negative hematology ROS (+)   Anesthesia Other Findings   Reproductive/Obstetrics (+) Pregnancy                             Anesthesia Physical Anesthesia Plan  ASA: II  Anesthesia Plan: Epidural   Post-op Pain Management:    Induction:   Airway Management Planned: Natural Airway  Additional Equipment:   Intra-op Plan:   Post-operative Plan:   Informed Consent: I have reviewed the patients History and Physical, chart, labs and discussed the procedure including the risks, benefits and alternatives for the proposed anesthesia with the patient or authorized representative who has indicated his/her understanding and acceptance.     Plan Discussed with:   Anesthesia Plan Comments: (History and consent obtained with the use of a Spanish interpreter. I have discussed risks of neuraxial anesthesia including but not limited to infection, bleeding, nerve injury, back pain, headache, seizures, and failure of block. Patient denies bleeding disorders and is not currently anticoagulated. Labs have been reviewed. Risks and benefits discussed. All  patient's questions answered.   Platelets 320)        Anesthesia Quick Evaluation

## 2016-05-17 NOTE — H&P (Signed)
Jade Horn is a 40 y.o. female presenting for active labor Has received prenatal care in low risk clinic . OB History    Gravida Para Term Preterm AB Living   3 2 2  0 0 2   SAB TAB Ectopic Multiple Live Births   0 0 0   2     Past Medical History:  Diagnosis Date  . Polio osteopathy of lower leg (HCC)    Past Surgical History:  Procedure Laterality Date  . LEG SURGERY     s/p Polio- 5 surgeries for bone  . LEG SURGERY Right    6 times right leg and foot, due to congenital abnormality   Family History: family history includes Diabetes in her father; Hyperlipidemia in her father; Hypertension in her father and mother. Social History:  reports that she has never smoked. She has never used smokeless tobacco. She reports that she does not drink alcohol or use drugs.     Maternal Diabetes: No Genetic Screening: Declined Maternal Ultrasounds/Referrals: Normal Fetal Ultrasounds or other Referrals:  None Maternal Substance Abuse:  No Significant Maternal Medications:  None Significant Maternal Lab Results:  Lab values include: Group B Strep negative Other Comments:  None  Review of Systems  Constitutional: Negative for chills and fever.  Respiratory: Negative for shortness of breath.   Gastrointestinal: Positive for abdominal pain. Negative for constipation, diarrhea, nausea and vomiting.  Genitourinary: Negative for dysuria.  Musculoskeletal: Negative for back pain.  Neurological: Negative for dizziness.   Maternal Medical History:  Reason for admission: Contractions.  Nausea.  Contractions: Onset was 3-5 hours ago.   Frequency: regular.   Perceived severity is moderate.    Fetal activity: Perceived fetal activity is normal.   Last perceived fetal movement was within the past hour.    Prenatal complications: No bleeding, HIV, PIH, pre-eclampsia or preterm labor.   Prenatal Complications - Diabetes: none.    Dilation: 5 Effacement (%): 70 Station:  -2 Exam by:: Anyanwu Blood pressure 123/67, pulse (!) 55, temperature 98.2 F (36.8 C), resp. rate 20, last menstrual period 08/12/2015. Maternal Exam:  Uterine Assessment: Contraction strength is moderate.  Contraction frequency is regular.   Abdomen: Patient reports no abdominal tenderness. Fundal height is 39.   Estimated fetal weight is 8lb10oz.   Fetal presentation: vertex  Introitus: Normal vulva. Normal vagina.  Ferning test: not done.  Nitrazine test: not done.  Pelvis: adequate for delivery.   Cervix: Cervix evaluated by digital exam.     Fetal Exam Fetal Monitor Review: Mode: ultrasound.   Baseline rate: 140.  Variability: moderate (6-25 bpm).   Pattern: accelerations present and no decelerations.    Fetal State Assessment: Category I - tracings are normal.     Physical Exam  Constitutional: She is oriented to person, place, and time. She appears well-developed and well-nourished. No distress.  HENT:  Head: Normocephalic.  Cardiovascular: Normal rate, regular rhythm and normal heart sounds.   Respiratory: Effort normal and breath sounds normal. No respiratory distress. She has no wheezes. She has no rales.  GI: Soft. She exhibits no distension. There is no tenderness. There is no rebound and no guarding.  Musculoskeletal: Normal range of motion.  Neurological: She is alert and oriented to person, place, and time.  Skin: Skin is warm and dry.  Psychiatric: She has a normal mood and affect.    Prenatal labs: ABO, Rh: O/POS/-- (04/25 1440) Antibody: NEG (04/25 1440) Rubella: 1.76 (04/25 1440) RPR: NON REAC (07/18 1310)  HBsAg: NEGATIVE (04/25 1440)  HIV: NONREACTIVE (07/18 1310)  GBS:     Assessment/Plan: [facility] at [redacted]w[redacted]d Active Labor  Admit to [facility] Routine labor orders Anticipate SVD   [facility] 05/17/2016, 10:21 AM

## 2016-05-17 NOTE — Progress Notes (Signed)
Interpreter requested. Basics reviewed via family supporter about patient not getting up without RN help. And stated not to fall asleep with infant in bed. Call bell explained and patient taught how to bulb suction how to watch for infant skin color changes. She verbalized understanding. Will report this to on coming RN.

## 2016-05-17 NOTE — Anesthesia Postprocedure Evaluation (Signed)
Anesthesia Post Note  Patient: Jade Horn  Procedure(s) Performed: * No procedures listed *  Patient location during evaluation: Mother Baby Anesthesia Type: Epidural Level of consciousness: awake and alert Pain management: satisfactory to patient Vital Signs Assessment: post-procedure vital signs reviewed and stable Respiratory status: respiratory function stable Cardiovascular status: stable Postop Assessment: no headache, no backache, epidural receding, patient able to bend at knees, no signs of nausea or vomiting and adequate PO intake Anesthetic complications: no     Last Vitals:  Vitals:   05/17/16 1840 05/17/16 1948  BP: (!) 124/54 (!) 118/57  Pulse: 63 71  Resp:  18  Temp: 36.9 C 36.8 C    Last Pain:  Vitals:   05/17/16 2100  TempSrc:   PainSc: 0-No pain   Pain Goal: Patients Stated Pain Goal: 4 (05/17/16 0920)               Karleen DolphinFUSSELL,Lincoln Kleiner

## 2016-05-18 LAB — RPR: RPR Ser Ql: NONREACTIVE

## 2016-05-18 MED ORDER — IBUPROFEN 600 MG PO TABS: 600.0000 mg | ORAL_TABLET | Freq: Four times a day (QID) | ORAL | 0 refills | Status: DC

## 2016-05-18 MED ORDER — ACETAMINOPHEN 325 MG PO TABS: 650.0000 mg | ORAL_TABLET | ORAL | 0 refills | Status: DC | PRN

## 2016-05-18 NOTE — Discharge Instructions (Signed)
Parto vaginal, Cuidados posteriores  °(Vaginal Delivery, Care After) °Siga estas instrucciones durante las próximas semanas. Estas indicaciones para el alta le proporcionan información general acerca de cómo deberá cuidarse después del parto. El médico también podrá darle instrucciones específicas. El tratamiento ha sido planificado según las prácticas médicas actuales, pero en algunos casos pueden ocurrir problemas. Comuníquese con el médico si tiene algún problema o tiene preguntas al volver a su casa.  °INSTRUCCIONES PARA EL CUIDADO EN EL HOGAR  °· Tome sólo medicamentos de venta libre o recetados, según las indicaciones del médico o del farmacéutico. °· No beba alcohol, especialmente si está amamantando o toma analgésicos. °· No mastique tabaco ni fume. °· No consuma drogas. °· Continúe con un adecuado cuidado perineal. El buen cuidado perineal incluye: °¨ Higienizarse de adelante hacia atrás. °¨ Mantener la zona perineal limpia. °· No use tampones ni duchas vaginales hasta que su médico la autorice. °· Dúchese, lávese el cabello y tome baños de inmersión según las indicaciones de su médico. °· Utilice un sostén que le ajuste bien y que brinde buen soporte a sus mamas. °· Consuma alimentos saludables. °· Beba suficiente líquido para mantener la orina clara o de color amarillo pálido. °· Consuma alimentos ricos en fibra como cereales y panes integrales, arroz, frijoles y frutas y verduras frescas todos los días. Estos alimentos pueden ayudarla a prevenir o aliviar el estreñimiento. °· Siga las recomendaciones de su médico relacionadas con la reanudación de actividades como subir escaleras, conducir automóviles, levantar objetos, hacer ejercicios o viajar. °· Hable con su médico acerca de reanudar la actividad sexual. Volver a la actividad sexual depende del riesgo de infección, la velocidad de la curación y la comodidad y su deseo de reanudarla. °· Trate de que alguien la ayude con las actividades del hogar y con  el recién nacido al menos durante un par de días después de salir del hospital. °· Descanse todo lo que pueda. Trate de descansar o tomar una siesta mientras el bebé está durmiendo. °· Aumente sus actividades gradualmente. °· Cumpla con todas las visitas de control programadas para después del parto. Es muy importante asistir a todas las citas programadas de seguimiento. En estas citas, su médico va a controlarla para asegurarse de que esté sanando física y emocionalmente. °SOLICITE ATENCIÓN MÉDICA SI:  °· Elimina coágulos grandes por la vagina. Guarde algunos coágulos para mostrarle al médico. °· Tiene una secreción con feo olor que proviene de la vagina. °· Tiene dificultad para orinar. °· Orina con frecuencia. °· Siente dolor al orinar. °· Nota un cambio en sus movimientos intestinales. °· Aumenta el enrojecimiento, el dolor o la hinchazón en la zona de la incisión vaginal (episiotomía) o el desgarro vaginal. °· Tiene pus que drena por la episiotomía o el desgarro vaginal. °· La episiotomía o el desgarro vaginal se abren. °· Sus mamas le duelen, están duras o enrojecidas. °· Sufre un dolor intenso de cabeza. °· Tiene visión borrosa o ve manchas. °· Se siente triste o deprimida. °· Tiene pensamientos acerca de lastimarse o dañar al recién nacido. °· Tiene preguntas acerca de su cuidado personal, el cuidado del recién nacido o acerca de los medicamentos. °· Se siente mareada o sufre un desmayo. °· Tiene una erupción. °· Tiene náuseas o vómitos. °· Usted amamantó al bebé y no ha tenido su período menstrual dentro de las 12 semanas después de dejar de amamantar. °· No amamanta al bebé y no tuvo su período menstrual en las últimas 12° semanas después del   parto. °· Tiene fiebre. °SOLICITE ATENCIÓN MÉDICA DE INMEDIATO SI:  °· Siente dolor persistente. °· Siente dolor en el pecho. °· Le falta el aire. °· Se desmaya. °· Siente dolor en la pierna. °· Siente dolor en el estómago. °· El sangrado vaginal satura dos o más  apósitos en 1 hora. °  °Esta información no tiene como fin reemplazar el consejo del médico. Asegúrese de hacerle al médico cualquier pregunta que tenga. °  °Document Released: 07/19/2005 Document Revised: 04/09/2015 °Elsevier Interactive Patient Education ©2016 [employer] ° °

## 2016-05-18 NOTE — Discharge Summary (Signed)
OB Discharge Summary     Patient Name: Jade MarekCatarina Rodriguez-Ortiz DOB: 06/19/1976 MRN: [id number]  Date of admission: 05/17/2016 Delivering MD: Youlanda MightySANTOS, JOSUE D   Date of discharge: 05/18/2016  Admitting diagnosis: LABOR Intrauterine pregnancy: 1572w6d     Secondary diagnosis:  Active Problems:   Labor and delivery, indication for care  IOL for  Additional problems: none     Discharge diagnosis: Term Pregnancy Delivered                                                                                                Post partum procedures:none  Augmentation: none  Complications: None  Hospital course:  Onset of Labor With Vaginal Delivery     40 y.o. yo G3P3003 at 3172w6d was admitted in Active Labor on 05/17/2016. Patient had an uncomplicated labor course as follows:  Membrane Rupture Time/Date: 4:32 PM ,05/17/2016   Intrapartum Procedures: Episiotomy: None [1]                                         Lacerations:  None [1]  Patient had a delivery of a Viable infant. 05/17/2016  Information for the patient's newborn:  Cherylann RatelRodriguez-Ortiz, Girl [place]atarina [[id number]][[id number]]  Delivery Method: Vaginal, Spontaneous Delivery (Filed from Delivery Summary)    Pateint had an uncomplicated postpartum course.  She is ambulating, tolerating a regular diet, passing flatus, and urinating well. Patient is discharged home in stable condition on 05/18/16.    Physical exam Vitals:   05/17/16 1840 05/17/16 1948 05/17/16 2315 05/18/16 0753  BP: (!) 124/54 (!) 118/57 (!) 111/53 110/68  Pulse: 63 71 67 65  Resp:  18 16 18   Temp: 98.4 F (36.9 C) 98.2 F (36.8 C) 97.8 F (36.6 C) 97.8 F (36.6 C)  TempSrc: Oral Oral Oral Oral  SpO2:  100% 100% 100%   General: alert, cooperative and no distress Lochia: appropriate Uterine Fundus: firm Incision: N/A DVT Evaluation: No evidence of DVT seen on physical exam. No significant calf/ankle edema. Labs: Lab Results  Component Value Date   WBC 7.0 05/17/2016    HGB 9.2 (L) 05/17/2016   HCT 30.0 (L) 05/17/2016   MCV 66.1 (L) 05/17/2016   PLT 320 05/17/2016   CMP Latest Ref Rng & Units 12/09/2015  Glucose 65 - 104 mg/dL 88  BUN 6 - 23 mg/dL -  Creatinine 0.4 - 1.2 mg/dL -  Sodium 782135 - 956145 mEq/L -  Potassium 3.5 - 5.1 mEq/L -  Chloride 96 - 112 mEq/L -    Discharge instruction: per After Visit Summary and "[name]".  After visit meds:    Medication List    TAKE these medications   acetaminophen 325 MG tablet Commonly known as:  TYLENOL Take 2 tablets (650 mg total) by mouth every 4 (four) hours as needed (for pain scale < 4).   ibuprofen 600 MG tablet Commonly known as:  ADVIL,MOTRIN Take 1 tablet (600 mg total) by mouth every 6 (six) hours.   prenatal  multivitamin Tabs tablet Take 1 tablet by mouth daily at 12 noon.       Diet: routine diet  Activity: Advance as tolerated. Pelvic rest for 6 weeks.   Outpatient follow up:6 weeks Follow up Appt:Future Appointments Date Time Provider Department Center  05/25/2016 6:30 AM [facility] None  06/21/2016 8:40 AM [name], NP [facility]   Follow up Visit:No Follow-up on file.  Postpartum contraception: Undecided  Newborn Data: Live born female  Birth Weight: 7 lb 9.5 oz (3445 g) APGAR: 8, 9  Baby Feeding: Breast Disposition:home with mother   05/18/2016 Ernestina Penna, MD

## 2016-05-18 NOTE — Progress Notes (Signed)
UR chart review completed.  

## 2016-05-18 NOTE — Lactation Note (Signed)
This note was copied from a baby's chart. Lactation Consultation Note  Patient Name: Girl Sarajane MarekCatarina Rodriguez-Ortiz NFAOZ'HToday's Date: 05/18/2016 Reason for consult: Initial assessment;Other (Comment) (limited English - Online interpreter - celina - [id number], Mom aware to cakll for latch assessment )  Baby is 21 hours old and has been to the breast consistently so far and per mom recently breast fed.  LC discussed the importance of having the baby assessed at the breast by RN or LC for Latch score and to call with feeding cues.  LC discussed supply and demand and the importance of STS feedings and in between. Mom reports she has been leaking the last part of her pregnancy.  LC reassured her that is a good sign. And per mom comfortable with hand expressing.  Per mom active with [place] [facility].  Mother informed of post-discharge support and given phone number to the lactation department, including services for phone call assistance;  out-patient appointments; and breastfeeding support group. List of other breastfeeding resources in the community given in the handout. Encouraged mother  to call for problems or concerns related to breastfeeding.   Maternal Data Has patient been taught Hand Expression?: Yes (per mom was shown and feel very comfortable ) Does the patient have breastfeeding experience prior to this delivery?: Yes  Feeding Feeding Type:  (baby recently breast fed ) Length of feed: 10 min  LATCH Score/Interventions                      Lactation Tools Discussed/Used [facility] Program: [facility] [facility]   Consult Status Consult Status: Follow-up Date: 05/18/16 Follow-up type: In-patient    Matilde SprangMargaret Ann Caryl Fate 05/18/2016, 2:09 PM

## 2016-05-21 ENCOUNTER — Other Ambulatory Visit: Payer: Self-pay | Admitting: Family Medicine

## 2016-05-25 ENCOUNTER — Inpatient Hospital Stay (HOSPITAL_COMMUNITY): Admission: RE | Admit: 2016-05-25 | Payer: Self-pay | Source: Ambulatory Visit

## 2016-06-21 ENCOUNTER — Ambulatory Visit: Payer: Self-pay | Admitting: Student

## 2017-04-07 ENCOUNTER — Ambulatory Visit (INDEPENDENT_AMBULATORY_CARE_PROVIDER_SITE_OTHER): Payer: Self-pay

## 2017-04-07 DIAGNOSIS — Z3201 Encounter for pregnancy test, result positive: Secondary | ICD-10-CM

## 2017-04-07 LAB — POCT PREGNANCY, URINE: Preg Test, Ur: POSITIVE — AB

## 2017-04-07 NOTE — Progress Notes (Signed)
Patient presented to the office for a UPT. UPT positve. Patient states that she had some spotting two months ago but doesn't remember if it was at the beginning or the end of the month. Advised patient to start prenatal vitamins and prenatal care. US is scheduled for 04/12/17 at 0900.

## 2017-04-12 ENCOUNTER — Ambulatory Visit (HOSPITAL_COMMUNITY)
Admission: RE | Admit: 2017-04-12 | Discharge: 2017-04-12 | Disposition: A | Discharge status: Home / Self Care | Payer: Self-pay | Source: Ambulatory Visit | Attending: Obstetrics & Gynecology | Admitting: Obstetrics & Gynecology

## 2017-04-12 DIAGNOSIS — Z3A10 10 weeks gestation of pregnancy: Secondary | ICD-10-CM | POA: Insufficient documentation

## 2017-04-12 DIAGNOSIS — Z3201 Encounter for pregnancy test, result positive: Secondary | ICD-10-CM | POA: Insufficient documentation

## 2017-04-26 ENCOUNTER — Encounter: Payer: Self-pay | Admitting: *Deleted

## 2017-04-26 ENCOUNTER — Encounter: Payer: Self-pay | Admitting: Medical

## 2017-04-26 ENCOUNTER — Ambulatory Visit (INDEPENDENT_AMBULATORY_CARE_PROVIDER_SITE_OTHER): Payer: Self-pay | Admitting: Medical

## 2017-04-26 DIAGNOSIS — O09521 Supervision of elderly multigravida, first trimester: Secondary | ICD-10-CM

## 2017-04-26 DIAGNOSIS — Z113 Encounter for screening for infections with a predominantly sexual mode of transmission: Secondary | ICD-10-CM

## 2017-04-26 DIAGNOSIS — Z3481 Encounter for supervision of other normal pregnancy, first trimester: Secondary | ICD-10-CM

## 2017-04-26 DIAGNOSIS — Z23 Encounter for immunization: Secondary | ICD-10-CM

## 2017-04-26 DIAGNOSIS — Z3480 Encounter for supervision of other normal pregnancy, unspecified trimester: Secondary | ICD-10-CM | POA: Insufficient documentation

## 2017-04-26 LAB — POCT URINALYSIS DIP (DEVICE)
Bilirubin Urine: NEGATIVE
Glucose, UA: NEGATIVE mg/dL
Hgb urine dipstick: NEGATIVE
KETONES UR: NEGATIVE mg/dL
NITRITE: NEGATIVE
PH: 6.5 (ref 5.0–8.0)
PROTEIN: NEGATIVE mg/dL
Specific Gravity, Urine: 1.02 (ref 1.005–1.030)
Urobilinogen, UA: 0.2 mg/dL (ref 0.0–1.0)

## 2017-04-26 NOTE — Addendum Note (Signed)
Addended by: Gerome Apley on: 04/26/2017 04:26 PM   Modules accepted: Orders

## 2017-04-26 NOTE — Progress Notes (Signed)
   PRENATAL VISIT NOTE  Subjective:  Jade Horn is a 41 y.o. G4P3003 at [redacted]w[redacted]d being seen today for initial prenatal care visit.  She is currently monitored for the following issues for this high-risk pregnancy and has Polio osteopathy of lower leg (HCC); AMA (advanced maternal age) multigravida 35+; Language barrier; and Supervision of other normal pregnancy, antepartum on her problem list.  Patient reports no complaints.  Contractions: Not present. Vag. Bleeding: None.  Movement: Absent. Denies leaking of fluid.   The following portions of the patient's history were reviewed and updated as appropriate: allergies, current medications, past family history, past medical history, past social history, past surgical history and problem list. Problem list updated.  Objective:   Vitals:   04/26/17 0812  BP: (!) 114/52  Pulse: 68  Weight: 129 lb 9.6 oz (58.8 kg)    Fetal Status: Fetal Heart Rate (bpm): 152   Movement: Absent     General:  Alert, oriented and cooperative. Patient is in no acute distress.  Skin: Skin is warm and dry. No rash noted.   Cardiovascular: Normal heart rate and rhythm noted  Respiratory: Normal respiratory effort, no problems with respiration noted. Clear to auscultation. No adventitious sounds   Abdomen: Soft, gravid, appropriate for gestational age.  Pain/Pressure: Absent     Pelvic: Cervical exam performed      closed/thick Breast exam: symmetric, no masses noted. No nipple discharge. No discoloration.  Extremities: Normal range of motion.  Edema: None  Mental Status:  Normal mood and affect. Normal behavior. Normal judgment and thought content.   Assessment and Plan:  Pregnancy: G4P3003 at [redacted]w[redacted]d  1. Supervision of other normal pregnancy, antepartum - Obstetric Panel, Including HIV - Culture, OB Urine - Flu Vaccine QUAD 36+ mos IM - Hemoglobinopathy evaluation - AFP, Quad Screen  2. Elderly multigravida in first trimester - AFP, Quad  Screen  First trimester warning symptoms and general obstetric precautions including but not limited to vaginal bleeding, contractions, leaking of fluid and fetal movement were reviewed in detail with the patient. Please refer to After Visit Summary for other counseling recommendations.  Return in about 4 weeks (around 05/24/2017) for LOB.   Vonzella Nipple, PA-C

## 2017-04-26 NOTE — Patient Instructions (Signed)
Primer trimestre de embarazo °(First Trimester of Pregnancy) °El primer trimestre de embarazo se extiende desde la semana 1 hasta el final de la semana 12 (mes 1 al mes 3). Durante este tiempo, el bebé comenzará a desarrollarse dentro suyo. Entre la semana 6 y la 8, se forman los ojos y el rostro, y los latidos del corazón pueden verse en la ecografía. Al final de las 12 semanas, todos los órganos del bebé están formados. La atención prenatal es toda la asistencia médica que usted recibe antes del nacimiento del bebé. Asegúrese de recibir una buena atención prenatal y de seguir todas las indicaciones del médico. °CUIDADOS EN EL HOGAR °Medicamentos: °· Tome los medicamentos solamente como se lo haya indicado el médico. Algunos medicamentos se pueden tomar durante el embarazo y otros no. °· Tome las vitaminas prenatales como se lo haya indicado el médico. °· Tome el medicamento que la ayuda a defecar (laxante suave) según sea necesario, si el médico lo autoriza. °Dieta °· Ingiera alimentos saludables de manera regular. °· El médico le indicará la cantidad de peso que puede aumentar. °· No coma carne cruda ni quesos sin cocinar. °· Si tiene malestar estomacal (náuseas) o vomita: °? Ingiera 4 o 5 comidas pequeñas por día en lugar de 3 abundantes. °? Intente comer algunas galletitas saladas. °? Beba líquidos entre las comidas, en lugar de hacerlo durante estas. °· Si tiene dificultad para defecar (estreñimiento): °? Consuma alimentos con alto contenido de fibra, como verduras y frutas frescos, y cereales integrales. °? Beba suficiente líquido para mantener el pis (orina) claro o de color amarillo pálido. °Actividad y ejercicios °· Haga ejercicios solamente como se lo haya indicado el médico. Deje de hacer ejercicios si tiene cólicos o dolor en la parte baja del vientre (abdomen) o en la cintura. °· Intente no estar de pie durante mucho tiempo. Mueva las piernas con frecuencia si debe estar de pie en un lugar durante  mucho tiempo. °· Evite levantar pesos excesivos. °· Use zapatos con tacones bajos. Mantenga una buena postura al sentarse y pararse. °· Puede tener relaciones sexuales, a menos que el médico le indique lo contrario. °Alivio del dolor o las molestias °· Use un sostén que le brinde buen soporte si le duelen las mamas. °· Dese baños con agua tibia (baños de asiento) para aliviar el dolor o las molestias a causa de las hemorroides. Use crema antihemorroidal si el médico se lo permite. °· Descanse con las piernas elevadas si tiene calambres o dolor de cintura. °· Use medias de descanso si tiene las venas de las piernas hinchadas y abultadas (venas varicosas). Eleve los pies durante 15 minutos, 3 o 4 veces por día. Limite la cantidad de sal en su dieta. °Cuidados prenatales °· Programe las visitas prenatales para la semana 12 de embarazo. °· Escriba sus preguntas. Llévelas cuando concurra a las visitas prenatales. °· Concurra a todas las visitas prenatales como se lo haya indicado el médico. °Seguridad °· Colóquese el cinturón de seguridad cuando conduzca. °· Haga una lista con los números de teléfono en caso de emergencia, en la cual deben incluirse los números de los familiares, los amigos, el hospital y los departamentos de policía y de bomberos. °Consejos generales °· Pídale al médico que la derive a clases prenatales en su localidad. Debe comenzar a tomar las clases antes de entrar en el mes 6 de embarazo. °· Pida ayuda si necesita asesoramiento o asistencia con la alimentación. El médico puede aconsejarla o indicarle dónde recurrir para recibir ayuda. °· No se dé baños de inmersión en agua caliente, baños   turcos ni saunas. °· No se haga duchas vaginales ni use tampones o toallas higiénicas perfumadas. °· No mantenga las piernas cruzadas durante mucho tiempo. °· Evite el contacto con las bandejas sanitarias de los gatos y la tierra que estos animales usan. °· No fume, no consuma hierbas ni beba alcohol. No tome  fármacos que el médico no haya autorizado. °· No consuma ningún producto que contenga tabaco, lo que incluye cigarrillos, tabaco de mascar o cigarrillos electrónicos. Si necesita ayuda para dejar de fumar, consulte al médico. Puede recibir asesoramiento u otro tipo de apoyo para dejar de fumar. °· Visite al dentista. En su casa, lávese los dientes con un cepillo dental suave. Pásese el hilo dental con suavidad. °SOLICITE AYUDA SI: °· Tiene mareos. °· Tiene cólicos leves o siente presión en la parte baja del vientre. °· Siente un dolor persistente en la zona del vientre. °· Sigue teniendo malestar estomacal, vomita o las heces son líquidas (diarrea). °· Observa una secreción, con mal olor que proviene de la vagina. °· Siente dolor al orinar. °· Tiene el rostro, las manos, las piernas o los tobillos más hinchados (inflamados). ° °SOLICITE AYUDA DE INMEDIATO SI: °· Tiene fiebre. °· Tiene una pérdida de líquido por la vagina. °· Tiene sangrado o pequeñas pérdidas vaginales. °· Tiene cólicos o dolor muy intensos en el vientre. °· Sube o baja de peso rápidamente. °· Vomita sangre. Puede ser similar a la borra del café °· Está en contacto con personas que tienen rubéola, la quinta enfermedad o varicela. °· Siente un dolor de cabeza muy intenso. °· Le falta el aire. °· Sufre cualquier tipo de traumatismo, por ejemplo, debido a una caída o un accidente automovilístico. ° °Esta información no tiene como fin reemplazar el consejo del médico. Asegúrese de hacerle al médico cualquier pregunta que tenga. °Document Released: 10/15/2008 Document Revised: 08/09/2014 Document Reviewed: 05/29/2013 °Elsevier Interactive Patient Education © 2017 Elsevier Inc. ° °

## 2017-04-27 LAB — HEMOGLOBINOPATHY EVALUATION
HGB C: 0 %
HGB S: 0 %
HGB VARIANT: 0 %
Hemoglobin A2 Quantitation: 2.4 % (ref 1.8–3.2)
Hemoglobin F Quantitation: 0 % (ref 0.0–2.0)
Hgb A: 97.6 % (ref 96.4–98.8)

## 2017-04-27 LAB — OBSTETRIC PANEL, INCLUDING HIV
ANTIBODY SCREEN: NEGATIVE
BASOS: 0 %
Basophils Absolute: 0 10*3/uL (ref 0.0–0.2)
EOS (ABSOLUTE): 0.1 10*3/uL (ref 0.0–0.4)
EOS: 1 %
HEMATOCRIT: 33.9 % — AB (ref 34.0–46.6)
HEMOGLOBIN: 11.1 g/dL (ref 11.1–15.9)
HIV SCREEN 4TH GENERATION: NONREACTIVE
Hepatitis B Surface Ag: NEGATIVE
IMMATURE GRANS (ABS): 0 10*3/uL (ref 0.0–0.1)
Immature Granulocytes: 0 %
LYMPHS: 26 %
Lymphocytes Absolute: 1.9 10*3/uL (ref 0.7–3.1)
MCH: 25.2 pg — ABNORMAL LOW (ref 26.6–33.0)
MCHC: 32.7 g/dL (ref 31.5–35.7)
MCV: 77 fL — AB (ref 79–97)
MONOS ABS: 0.6 10*3/uL (ref 0.1–0.9)
Monocytes: 8 %
NEUTROS ABS: 4.7 10*3/uL (ref 1.4–7.0)
Neutrophils: 65 %
Platelets: 343 10*3/uL (ref 150–379)
RBC: 4.41 x10E6/uL (ref 3.77–5.28)
RDW: 15.9 % — ABNORMAL HIGH (ref 12.3–15.4)
RH TYPE: POSITIVE
RPR Ser Ql: NONREACTIVE
RUBELLA: 1.78 {index} (ref >0.99)
WBC: 7.3 10*3/uL (ref 3.4–10.8)

## 2017-04-27 LAB — GC/CHLAMYDIA PROBE AMP (~~LOC~~) NOT AT ARMC
Chlamydia: NEGATIVE
Neisseria Gonorrhea: NEGATIVE

## 2017-04-27 LAB — AFP TUMOR MARKER: AFP, SERUM, TUMOR MARKER: 11.8 ng/mL — AB (ref 0.0–8.3)

## 2017-04-28 LAB — CULTURE, OB URINE

## 2017-04-28 LAB — URINE CULTURE, OB REFLEX

## 2017-05-02 NOTE — Progress Notes (Signed)
Per [facility], the order for Quad screen was canceled because the patient was 12w 4d at the time that the test was ordered. If the test is taken too early it will cause inaccurate results.

## 2017-05-19 LAB — AFP TETRA

## 2017-05-19 LAB — SPECIMEN STATUS REPORT

## 2017-05-25 ENCOUNTER — Encounter: Payer: Self-pay | Admitting: Advanced Practice Midwife

## 2017-05-30 ENCOUNTER — Encounter: Payer: Self-pay | Admitting: Advanced Practice Midwife

## 2017-05-31 ENCOUNTER — Encounter: Payer: Self-pay | Admitting: Family Medicine

## 2017-06-08 ENCOUNTER — Encounter: Payer: Self-pay | Admitting: Family Medicine

## 2017-06-09 ENCOUNTER — Ambulatory Visit (INDEPENDENT_AMBULATORY_CARE_PROVIDER_SITE_OTHER): Payer: Self-pay | Admitting: Student

## 2017-06-09 VITALS — BP 111/54 | HR 73 | Wt 134.4 lb

## 2017-06-09 DIAGNOSIS — O09522 Supervision of elderly multigravida, second trimester: Secondary | ICD-10-CM

## 2017-06-09 DIAGNOSIS — Z3480 Encounter for supervision of other normal pregnancy, unspecified trimester: Secondary | ICD-10-CM

## 2017-06-09 MED ORDER — FERROUS SULFATE 325 (65 FE) MG PO TBEC: 325.0000 mg | DELAYED_RELEASE_TABLET | Freq: Three times a day (TID) | ORAL | 3 refills | Status: AC

## 2017-06-09 NOTE — Progress Notes (Signed)
Pt stated feels like having contraction pain lower abdominal

## 2017-06-09 NOTE — Patient Instructions (Signed)
Crecimiento del beb durante el embarazo (How a Baby Grows During Pregnancy) El embarazo comienza cuando el semen de un hombre ingresa al vulo de una mujer (fecundacin). Esto ocurre en una de las trompas de Falopio que conecta los ovarios con el tero. Al vulo fecundado se lo denomina embrin hasta que alcanza las 10semanas. A partir de las 10semanas y hasta el momento del parto, se llama feto. El vulo fecundado se desplaza por la trompa de Falopio hasta llegar al tero y luego se implanta en el endometrio y empieza crecer. El feto en crecimiento recibe oxgeno y nutrientes a travs del torrente sanguneo de la embarazada y de los tejidos que se forman (placenta) para la sustentacin fetal. La placenta es el sistema de sustentacin de la vida del feto, proporciona la nutricin y elimina los desechos. Informarse tanto como pueda sobre el embarazo y la forma en que se desarrolla el beb puede ayudarla a disfrutar de la experiencia, y, adems, a que se d cuenta de cundo puede haber un problema y cundo hacer preguntas. CUNTO DURA UN EMBARAZO NORMAL? Generalmente, el embarazo dura 280das, o unas 40semanas. Se divide tres trimestres:  Primer trimestre: desde la semana0 a la13.  Segundo trimestre: desde la semana14 a la27.  Tercer trimestre: desde la semana28 a la40. El da que se considera que el beb est listo para nacer (a trmino) es la fecha prevista de parto. CMO SE DESARROLLA EL BEB MES A MES? Primer mes  El vulo fecundado se implanta dentro del tero.  Algunas clulas formarn la placenta, y otras formarn el feto.  Empiezan a desarrollarse los brazos, las piernas, la mdula espinal, los pulmones y el corazn.  Al final del primer mes, el corazn comienza a latir. Segundo mes  Se forman los huesos, el odo interno, los prpados, las manos y los pies.  Se desarrollan los genitales.  Al final de las 8semanas, todos los rganos importantes estn en  desarrollo. Tercer mes  Se estn formando todos los rganos internos.  Se forman los dientes debajo de las encas.  Empiezan a crecer los huesos y los msculos. La columna vertebral tiene movimiento de flexin.  La piel es transparente.  Empiezan a formarse las uas de las manos y de los pies.  Los brazos y las piernas siguen alargndose, y se desarrollan las manos y los pies.  El feto mide aproximadamente 3pulgadas (7,6cm) de largo. Cuarto mes  La placenta est totalmente formada.  Se han formado los rganos sexuales externos, el cuello, las orejas, las cejas, los prpados y las uas de las manos.  El feto puede or, tragar y mover los brazos y las piernas.  Los riones empiezan a producir orina.  La piel est recubierta por una sustancia sebcea blanca (unto sebceo) y un vello muy fino (lanugo). Quinto mes  El feto se mueve ms y es posible sentirlo por primera vez (da pataditas).  Empieza a dormir y despertarse, y tal vez comience a chuparse el dedo.  Crecen las uas en las puntas de los dedos.  Funciona el rgano del sistema digestivo que produce bilis (vescula biliar) y ayuda a digerir los nutrientes.  Si el beb es nia, tiene vulos en los ovarios. Si el beb es varn, los testculos empiezan a descender hasta el escroto. Sexto mes  Se han formado los pulmones, pero el feto an no puede respirar.  Los ojos se abren. El cerebro sigue desarrollndose.  El beb tiene huellas en los dedos de las manos y   los pies. El cabello del beb se vuelve ms abundante.  A fines del segundo trimestre, el feto mide aproximadamente 9pulgadas (22,9cm) de largo. Sptimo mes  El feto patea y se estira.  Los ojos se han desarrollado lo suficiente como para percibir los cambios de luz.  Las manos pueden hacer movimientos de prensin.  El feto responde a los ruidos. Octavo mes  Todos los rganos, as como los sistemas y aparatos del organismo, estn totalmente  desarrollados y en funcionamiento.  Los huesos se solidifican, y se desarrollan los botones gustativos. Es posible que el feto tenga hipo.  Determinadas regiones del cerebro an se estn desarrollando. El crneo sigue siendo blando. Noveno mes  El feto aumenta aproximadamente libra (230g) cada semana.  Los pulmones estn totalmente desarrollados.  Se desarrollan los hbitos de sueo.  Generalmente, el feto se acomoda con la cabeza hacia abajo (presentacin ceflica de vrtice) en el tero para prepararse para el parto. En cambio, si los glteos se acomodan en esta posicin, el beb est de nalgas.  El feto pesa entre 6 y 9libras (2,72 y 4,08kg) y mide entre 19 y 20pulgadas (48,26 a 50,8cm) de largo. QU PUEDO HACER PARA QUE EL EMBARAZO SEA SANO Y PARA AYUDAR AL BEB A DESARROLLARSE? Comida y bebida  Consuma una dieta saludable. ? Hable con el mdico para asegurarse de que est recibiendo los nutrientes que usted y el beb necesitan. ? Visite www.choosemyplate.gov para obtener ms informacin sobre cmo crear una dieta saludable.  El mdico le aconsejar cul es la cantidad saludable de peso a aumentar durante el embarazo, por lo general, entre 25 y 35libras (11 y 16kg). Puede ser necesario que: ? Aumente ms si tena bajo peso antes de quedar embarazada o si est embarazada de ms de un beb. ? Aumente menos si tena sobrepeso u obesidad cuando qued embarazada. Medicamentos y vitaminas  Tome las vitaminas prenatales como se lo haya indicado el mdico, entre ellas, cido flico, hierro, calcio y vitaminaD, que son importantes para el desarrollo saludable.  Tome los medicamentos solamente como se lo haya indicado el mdico. Lea las etiquetas y consulte al farmacutico o al mdico si puede tomar medicamentos de venta libre, suplementos y medicamentos recetados durante el embarazo. Actividades  Haga actividad fsica como se lo haya aconsejado el mdico. Pdale al mdico que  le recomiende actividades que sean seguras para usted, como caminar o practicar natacin.  No participe en deportes extremos ni extenuantes. Estilo de vida  No beba alcohol.  No consuma ningn producto que contenga tabaco, lo que incluye cigarrillos, tabaco de mascar o cigarrillos electrnicos. Si necesita ayuda para dejar de fumar, consulte al mdico.  No consuma drogas. Seguridad  No se exponga al mercurio, al plomo ni a otros metales pesados. Pregntele al mdico acerca de las fuentes comunes de estos metales pesados.  Evite la infeccin por listeria durante el embarazo. Tome las siguientes precauciones: ? No coma quesos blandos ni fiambres. ? No coma perros calientes, salvo que hayan sido calentados al punto de emitir vapor, por ejemplo, en el microondas. ? No tome leche no pasteurizada.  Evite la infeccin por toxoplasmosis durante el embarazo. Tome las siguientes precauciones: ? No cambie la arena sanitaria del gato, si tiene uno. Pdale a otra persona que lo haga por usted. ? Use guantes de jardinera mientras trabaja en el jardn. Instrucciones generales  Concurra a todas las visitas de control como se lo haya indicado el mdico. Esto es importante. Estas incluyen las visitas   de cuidado prenatal y las pruebas de deteccin.  Mantenga las enfermedades crnicas bajo control. Trabaje en estrecha colaboracin con el mdico para mantener las enfermedades bajo control, por ejemplo, la diabetes. CMO S SI EL BEB SE EST DESARROLLANDO BIEN? En cada visita de cuidado prenatal, el mdico har varios estudios diferentes para controlar su estado de salud y hacer un seguimiento del desarrollo del beb. Estos incluyen los siguientes:  Altura uterina. ? El mdico le medir el vientre en crecimiento desde la parte superior a la inferior con una cinta mtrica. ? Adems, le palpar el vientre para determinar la posicin del beb.  Latido cardaco. ? Una ecografa realizada en el primer  trimestre puede confirmar el embarazo y mostrar un latido cardaco, dependiendo del tiempo de gestacin. ? El mdico controlar la frecuencia cardaca del beb en cada visita de cuidado prenatal. ? A medida que se aproxima la fecha de parto, tal vez se hagan controles habituales de la frecuencia cardaca para garantizar que no haya sufrimiento fetal.  Ecografa del segundo trimestre. ? Esta ecografa controla el desarrollo del beb y tambin indica su sexo. QU DEBO HACER SI TENGO ALGUNA INQUIETUD RESPECTO DEL DESARROLLO DEL BEB? Hable siempre con el mdico si tiene alguna inquietud. Esta informacin no tiene como fin reemplazar el consejo del mdico. Asegrese de hacerle al mdico cualquier pregunta que tenga. Document Released: 01/05/2008 Document Revised: 11/10/2015 Document Reviewed: 12/26/2013 Elsevier Interactive Patient Education  2018 [employer]vier Inc.  

## 2017-06-09 NOTE — Progress Notes (Signed)
   PRENATAL VISIT NOTE  Subjective:  Sarajane MarekCatarina Rodriguez-Ortiz is a 41 y.o. G4P3003 at 7038w5d being seen today for ongoing prenatal care.  She is currently monitored for the following issues for this low-risk pregnancy and has Polio osteopathy of lower leg (HCC); AMA (advanced maternal age) multigravida 35+; Language barrier; and Supervision of other normal pregnancy, antepartum on their problem list.  Patient reports no complaints.  Contractions: Irritability. Vag. Bleeding: None.  Movement: Absent. Denies leaking of fluid.   The following portions of the patient's history were reviewed and updated as appropriate: allergies, current medications, past family history, past medical history, past social history, past surgical history and problem list. Problem list updated.  Objective:   Vitals:   06/09/17 1034  BP: (!) 111/54  Pulse: 73  Weight: 134 lb 6.4 oz (61 kg)    Fetal Status: Fetal Heart Rate (bpm): 148   Movement: Absent     General:  Alert, oriented and cooperative. Patient is in no acute distress.  Skin: Skin is warm and dry. No rash noted.   Cardiovascular: Normal heart rate noted  Respiratory: Normal respiratory effort, no problems with respiration noted  Abdomen: Soft, gravid, appropriate for gestational age.  Pain/Pressure: Present     Pelvic: Cervical exam deferred        Extremities: Normal range of motion.  Edema: None  Mental Status:  Normal mood and affect. Normal behavior. Normal judgment and thought content.   Assessment and Plan:  Pregnancy: G4P3003 at 2838w5d  1. Elderly multigravida in second trimester -begin antenatal testing at 24 weeks - US MFM OB DETAIL +14 WK; Future -Does not want further genetic testing 2. Supervision of other normal pregnancy, antepartum Doing well  Preterm labor symptoms and general obstetric precautions including but not limited to vaginal bleeding, contractions, leaking of fluid and fetal movement were reviewed in detail with the  patient. Please refer to After Visit Summary for other counseling recommendations.  Return in about 4 weeks (around 07/07/2017).   Marylene LandKathryn Lorraine Kooistra, CNM

## 2017-06-16 ENCOUNTER — Other Ambulatory Visit (HOSPITAL_COMMUNITY): Payer: Self-pay | Admitting: *Deleted

## 2017-06-16 ENCOUNTER — Encounter (HOSPITAL_COMMUNITY): Payer: Self-pay

## 2017-06-16 ENCOUNTER — Ambulatory Visit (HOSPITAL_COMMUNITY)
Admission: RE | Admit: 2017-06-16 | Discharge: 2017-06-16 | Disposition: A | Discharge status: Home / Self Care | Payer: Self-pay | Source: Ambulatory Visit | Attending: Student | Admitting: Student

## 2017-06-16 DIAGNOSIS — O09522 Supervision of elderly multigravida, second trimester: Secondary | ICD-10-CM

## 2017-06-16 DIAGNOSIS — Z363 Encounter for antenatal screening for malformations: Secondary | ICD-10-CM | POA: Insufficient documentation

## 2017-06-16 DIAGNOSIS — Z3A19 19 weeks gestation of pregnancy: Secondary | ICD-10-CM | POA: Insufficient documentation

## 2017-07-07 ENCOUNTER — Ambulatory Visit (INDEPENDENT_AMBULATORY_CARE_PROVIDER_SITE_OTHER): Payer: Self-pay | Admitting: Student

## 2017-07-07 DIAGNOSIS — O09521 Supervision of elderly multigravida, first trimester: Secondary | ICD-10-CM

## 2017-07-07 DIAGNOSIS — O09522 Supervision of elderly multigravida, second trimester: Secondary | ICD-10-CM

## 2017-07-07 DIAGNOSIS — Z3482 Encounter for supervision of other normal pregnancy, second trimester: Secondary | ICD-10-CM

## 2017-07-07 DIAGNOSIS — Z3480 Encounter for supervision of other normal pregnancy, unspecified trimester: Secondary | ICD-10-CM

## 2017-07-07 NOTE — Patient Instructions (Signed)
Etonogestrel implant Qu es este medicamento? El ETONOGESTREL es un dispositivo anticonceptivo (control de la natalidad). Se usa para evitar los embarazos. Se puede usar hasta por 3 aos. Este medicamento puede ser utilizado para otros usos; si tiene alguna pregunta consulte con su proveedor de atencin mdica o con su farmacutico. MARCAS COMUNES: Implanon, Nexplanon Qu le debo informar a mi profesional de la salud antes de tomar este medicamento? Necesita saber si usted presenta alguno de los siguientes problemas o situaciones: sangrado vaginal anormal enfermedad vascular o cogulos sanguneos cncer de mama, cervical, heptico depresin diabetes enfermedad de la vescula biliar dolores de cabeza enfermedad cardiaca o ataque cardiaco reciente alta presin sangunea alto nivel de colesterol enfermedad renal enfermedad heptica convulsiones fuma tabaco una reaccin alrgica o inusual al etonogestrel, otras hormonas, anestsicos o antispticos, medicamentos, alimentos, colorantes o conservantes si est embarazada o buscando quedar embarazada si est amamantando a un beb Cmo debo utilizar este medicamento? Un profesional de la salud inserta este dispositivo justo debajo de la piel en la parte interior del brazo. Hable con su pediatra para informarse acerca del uso de este medicamento en nios. Puede requerir atencin especial. Sobredosis: Pngase en contacto inmediatamente con un centro toxicolgico o una sala de urgencia si usted cree que haya tomado demasiado medicamento. ATENCIN: Este medicamento es solo para usted. No comparta este medicamento con nadie. Qu sucede si me olvido de una dosis? No se aplica en este caso. Qu puede interactuar con este medicamento? No tome este medicamento con ninguno de los siguientes frmacos: amprenavir bosentano fosamprenavir Este medicamento tambin podra interactuar con los siguientes medicamentos: barbitricos para inducir el sueo o para el  tratamiento de convulsiones ciertos medicamentos para infecciones micticas, tales como itraconazol y ketoconazol jugo de toronja griseofulvina medicamentos para tratar convulsiones, tales como carbamazepina, felbamato, oxcarbazepina, fenitona, topiramato modafinilo fenilbutazona rifampicina rufinamida algunos medicamentos para tratar la infeccin por el VIH, tales como atazanavir, indinavir, lopinavir, nelfinavir, tipranavir, ritonavir hierba de [place] Puede ser que esta lista no menciona todas las posibles interacciones. Informe a su profesional de la salud de todos los productos a base de hierbas, medicamentos de venta libre o suplementos nutritivos que est tomando. Si usted fuma, consume bebidas alcohlicas o si utiliza drogas ilegales, indqueselo tambin a su profesional de la salud. Algunas sustancias pueden interactuar con su medicamento. A qu debo estar atento al usar este medicamento? Este producto no protege contra la infeccin por el VIH (SIDA) u otras enfermedades de transmisin sexual. Usted debe sentir el implante al presionar con las yemas de los dedos sobre la piel donde se insert. Contacte a su mdico si no se siente el implante y usa un mtodo anticonceptivo no hormonal (como el condn) hasta que el mdico confirma que el implante est en su lugar. Si siente que el implante puede haber roto o doblado en su brazo, pngase en contacto con su proveedor de atencin mdica. Qu efectos secundarios puedo tener al utilizar este medicamento? Efectos secundarios que debe informar a su mdico o a su profesional de la salud tan pronto como sea posible: reacciones alrgicas, como erupcin cutnea, picazn o urticarias, e hinchazn de la cara, los labios o la lengua bultos en las mamas cambios en las emociones o el estado de nimo estado de nimo deprimido sangrado menstrual intenso o prolongado dolor, irritacin, hinchazn o moretones en el lugar de la insercin cicatriz en el lugar de la  insercin signos de infeccin en el sitio de insercin tales como fiebre, y enrojecimiento de   la piel, dolor o secrecin signos de embarazo signos y sntomas de un cogulo sanguneo, tales como problemas respiratorios; cambios en la visin; dolor en el pecho; dolor de cabeza severo, repentino; dolor, hinchazn, calor en la pierna; dificultad para hablar; entumecimiento o debilidad repentina de la cara, el brazo o la pierna signos y sntomas de lesin al hgado, como orina amarilla oscura o marrn; sensacin general de estar enfermo o sntomas gripales; heces claras; prdida de apetito; nuseas; dolor en la regin abdominal superior derecha; cansancio o debilidad inusual; color amarillento de los ojos o la piel sangrado vaginal inusual, secrecin signos y sntomas de un derrame cerebral, tales como cambios en la visin; confusin; dificultad para hablar o entender; dolores de cabeza severos; entumecimiento o debilidad repentina de la cara, el brazo o la pierna; problemas al caminar; mareo; prdida del equilibrio o la coordinacin Efectos secundarios que generalmente no requieren atencin mdica (infrmelos a su mdico o a su profesional de la salud si persisten o si son molestos): acn dolor de espalda dolor en las mamas cambios de peso mareos sensacin general de estar enfermo o sntomas gripales dolor de cabeza sangrado menstrual irregular nuseas dolor de garganta irritacin o inflamacin vaginal Puede ser que esta lista no menciona todos los posibles efectos secundarios. Comunquese a su mdico por asesoramiento mdico sobre los efectos secundarios. Usted puede informar los efectos secundarios a la FDA por telfono al 1-800-FDA-1088. Dnde debo guardar mi medicina? Este medicamento se administra en hospitales o clnicas y no necesitar guardarlo en su domicilio. ATENCIN: Este folleto es un resumen. Puede ser que no cubra toda la posible informacin. Si usted tiene preguntas acerca de esta medicina,  consulte con su mdico, su farmacutico o su profesional de la salud.  2018 Elsevier/Gold Standard (2016-08-19 00:00:00)  

## 2017-07-07 NOTE — Progress Notes (Signed)
   PRENATAL VISIT NOTE  Subjective:  Jade Horn is a 41 y.o. G4P3003 at 6956w5d being seen today for ongoing prenatal care.  She is currently monitored for the following issues for this high-risk pregnancy and has Polio osteopathy of lower leg (HCC); AMA (advanced maternal age) multigravida 35+; Language barrier; and Supervision of other normal pregnancy, antepartum on their problem list.  Patient reports no complaints.  Contractions: Not present. Vag. Bleeding: None.  Movement: Present. Denies leaking of fluid.   The following portions of the patient's history were reviewed and updated as appropriate: allergies, current medications, past family history, past medical history, past social history, past surgical history and problem list. Problem list updated.  Objective:   Vitals:   07/07/17 0755  BP: 109/62  Pulse: 71  Weight: 138 lb 14.4 oz (63 kg)    Fetal Status: Fetal Heart Rate (bpm): 138 Fundal Height: 20 cm Movement: Present     General:  Alert, oriented and cooperative. Patient is in no acute distress.  Skin: Skin is warm and dry. No rash noted.   Cardiovascular: Normal heart rate noted  Respiratory: Normal respiratory effort, no problems with respiration noted  Abdomen: Soft, gravid, appropriate for gestational age.  Pain/Pressure: Absent     Pelvic: Cervical exam deferred        Extremities: Normal range of motion.  Edema: None  Mental Status:  Normal mood and affect. Normal behavior. Normal judgment and thought content.   Assessment and Plan:  Pregnancy: G4P3003 at 556w5d  1. Supervision of other normal pregnancy, antepartum Doing well, no complaints.   2. Elderly multigravida in first trimester Planning for US on 12-27 for growth and then will have growth US 4 weeks after that.   Preterm labor symptoms and general obstetric precautions including but not limited to vaginal bleeding, contractions, leaking of fluid and fetal movement were reviewed in detail  with the patient. Please refer to After Visit Summary for other counseling recommendations.  Return in about 2 weeks (around 07/21/2017), or ROB.   Marylene LandKathryn Lorraine Cornel Werber, CNM

## 2017-07-07 NOTE — Progress Notes (Signed)
[employer] interpreter Trula OreChristina [phone]

## 2017-07-22 ENCOUNTER — Ambulatory Visit (INDEPENDENT_AMBULATORY_CARE_PROVIDER_SITE_OTHER): Payer: Self-pay | Admitting: Obstetrics and Gynecology

## 2017-07-22 VITALS — BP 103/62 | HR 69 | Wt 140.7 lb

## 2017-07-22 DIAGNOSIS — Z789 Other specified health status: Secondary | ICD-10-CM

## 2017-07-22 DIAGNOSIS — O09521 Supervision of elderly multigravida, first trimester: Secondary | ICD-10-CM

## 2017-07-22 DIAGNOSIS — Z3480 Encounter for supervision of other normal pregnancy, unspecified trimester: Secondary | ICD-10-CM

## 2017-07-22 NOTE — Progress Notes (Signed)
   PRENATAL VISIT NOTE  Subjective:  Jade Horn is a 41 y.o. G4P3003 at 6367w6d being seen today for ongoing prenatal care.  She is currently monitored for the following issues for this high-risk pregnancy and has Polio osteopathy of lower leg (HCC); AMA (advanced maternal age) multigravida 35+; Language barrier; and Supervision of other normal pregnancy, antepartum on their problem list.  Patient reports no complaints.  Contractions: Not present. Vag. Bleeding: None.  Movement: Present. Denies leaking of fluid.   The following portions of the patient's history were reviewed and updated as appropriate: allergies, current medications, past family history, past medical history, past social history, past surgical history and problem list. Problem list updated.  Objective:   Vitals:   07/22/17 0841  BP: 103/62  Pulse: 69  Weight: 140 lb 11.2 oz (63.8 kg)    Fetal Status: Fetal Heart Rate (bpm): 135 Fundal Height: 24 cm Movement: Present     General:  Alert, oriented and cooperative. Patient is in no acute distress.  Skin: Skin is warm and dry. No rash noted.   Cardiovascular: Normal heart rate noted  Respiratory: Normal respiratory effort, no problems with respiration noted  Abdomen: Soft, gravid, appropriate for gestational age.  Pain/Pressure: Absent     Pelvic: Cervical exam deferred        Extremities: Normal range of motion.  Edema: None  Mental Status:  Normal mood and affect. Normal behavior. Normal judgment and thought content.   Assessment and Plan:  Pregnancy: G4P3003 at 4767w6d  1. Elderly multigravida in first trimester  Growth US scheduled for 12/27 then every 4 weeks.  2. Language barrier  Spanish interpretor used   3. Supervision of other normal pregnancy, antepartum  Doing well.  No complaints    There are no diagnoses linked to this encounter. Preterm labor symptoms and general obstetric precautions including but not limited to vaginal bleeding,  contractions, leaking of fluid and fetal movement were reviewed in detail with the patient. Please refer to After Visit Summary for other counseling recommendations.  Return in about 6 days (around 07/28/2017), or For Growth US, then 2 weeks for high risk visit .   Venia CarbonJennifer Rasch, NP

## 2017-07-22 NOTE — Progress Notes (Signed)
[employer] interpreter [place]ia [phone]

## 2017-07-28 ENCOUNTER — Ambulatory Visit (HOSPITAL_COMMUNITY)
Admission: RE | Admit: 2017-07-28 | Discharge: 2017-07-28 | Disposition: A | Discharge status: Home / Self Care | Payer: Self-pay | Source: Ambulatory Visit | Attending: Student | Admitting: Student

## 2017-08-02 NOTE — L&D Delivery Note (Signed)
Patient is a 42 y.o. now [id number] s/p NSVD at 5648w1d, who was admitted for SROM.  She progressed with augmentation to complete and pushed minutes to deliver.  Cord clamping delayed by 1 minute then clamped by me with supervision and cut by mother of the patient.  Placenta intact and spontaneous, bleeding minimal.  No abrasions or lacerations requiring repair. She requests Nexplanon for birth control.  Delivery Note At 1:22 AM a viable female was delivered via Vaginal, Spontaneous (Presentation: ROA) in usual fashion.  APGAR: 9, 9; weight pending.   Placenta status: intact.  Cord: 3V with the following complications: none.  Cord pH: N/A  Anesthesia:  None Episiotomy: None Lacerations: None Suture Repair: None Est. Blood Loss (mL): 200  Mom to postpartum.  Baby to Couplet care / Skin to Skin.  Ellwood DenseAlison Saran Laviolette, DO 10/23/17, 1:38 AM

## 2017-08-12 ENCOUNTER — Ambulatory Visit (INDEPENDENT_AMBULATORY_CARE_PROVIDER_SITE_OTHER): Payer: Self-pay | Admitting: Obstetrics and Gynecology

## 2017-08-12 ENCOUNTER — Encounter: Payer: Self-pay | Admitting: Obstetrics and Gynecology

## 2017-08-12 VITALS — BP 104/69 | HR 76 | Wt 142.0 lb

## 2017-08-12 DIAGNOSIS — O09522 Supervision of elderly multigravida, second trimester: Secondary | ICD-10-CM

## 2017-08-12 DIAGNOSIS — Z3480 Encounter for supervision of other normal pregnancy, unspecified trimester: Secondary | ICD-10-CM

## 2017-08-12 DIAGNOSIS — Z23 Encounter for immunization: Secondary | ICD-10-CM

## 2017-08-12 NOTE — Patient Instructions (Signed)
Información sobre parto y trabajo de parto prematuros °Preterm Labor and Birth Information °El embarazo tiene generalmente una duración de 39 a 41 semanas. El trabajo de parto es prematuro cuando se inicia muy pronto. Comienza antes de completar las 37 semanas de embarazo. °¿Cuáles son los factores de riesgo del trabajo de parto prematuro? °Existen mayores probabilidades de trabajo de parto prematuro en mujeres con las siguientes características: °· Tuvieron una infección durante el embarazo. °· El cuello uterino es corto. °· Tuvieron trabajo de parto prematuro anteriormente. °· Se sometieron a una cirugía en el cuello uterino. °· Son menores de 17 años. °· Tienen más de 35 años. °· Son afroamericanas. °· Están embarazadas de dos o más bebés. °· Consumen drogas mientras están embarazadas. °· Fuman mientras están embarazadas. °· No aumentan de peso lo suficiente durante el embarazo. °· Se embarazaron inmediatamente después de otro embarazo. ° °¿Cuáles son los síntomas del trabajo de parto prematuro? °Los síntomas del trabajo de parto prematuro incluyen lo siguiente: °· Calambres. Los calambres pueden parecerse a los que tiene una mujer durante el período menstrual. Los calambres pueden presentarse con diarrea. °· Dolor de vientre (abdomen). °· Dolor en la zona lumbar. °· Tiene contracciones regulares o endurecimiento del útero. Siente como si el vientre se endurece. °· Presión en la zona inferior del vientre que parece empeorar. °· Pierde más líquido (secreción) por la vagina. El líquido puede ser acuoso o con sangre. °· Ruptura de la bolsa de aguas. ° °¿Por qué es importante notar los signos del trabajo de parto prematuro? °Los bebés que nacen antes de tiempo pueden no estar completamente desarrollados. Estos pueden tener un riesgo mayor de padecer: °· Problemas cardíacos a largo plazo. °· Problemas pulmonares a largo plazo. °· Dificultades para controlar los sistemas corporales, por ejemplo, respirar. °· Hemorragia  cerebral. °· Una afección que se denomina parálisis cerebral. °· Dificultades en el aprendizaje. °· Muerte. ° °Estos riesgos son mucho mayores para bebés que nacen antes de las 34 semanas de embarazo. °¿Cómo se trata el trabajo de parto prematuro? °El tratamiento depende de lo siguiente: °· El tiempo de embarazo. °· Su estado de salud. °· La salud del bebé. ° °El tratamiento puede incluir lo siguiente: °· Un punto (sutura) en el cuello uterino. Al parir, el cuello uterino se abre para que el bebé pueda salir. El punto impide que el cuello uterino se abra antes de tiempo. °· Permanecer en el hospital. °· Tomar medicamentos como, por ejemplo: °? Medicamentos hormonales. °? Medicamentos para detener las contracciones. °? Medicamentos para ayudar a la maduración de los pulmones del bebé. °? Medicamentos para evitar que el bebé desarrolle parálisis cerebral. ° °¿Qué debo hacer si estoy en trabajo de parto prematuro? °Si cree que está en trabajo de parto demasiado pronto, llame a su médico de inmediato. °¿Cómo puedo prevenir el trabajo de parto prematuro? °· No use productos que contengan tabaco. °? Estos incluyen cigarrillos, tabaco para mascar y cigarrillos electrónicos. °? Si necesita ayuda para dejar de fumar, consulte al médico. °· No consuma drogas. °· No tome ningún medicamento si el médico no se lo indicó. °· Consulte al médico antes de empezar a tomar cualquier suplemento de hierbas. °· Asegúrese aumentar de peso como corresponde. °· Tenga cuidado con las infecciones. Si cree que puede tener una infección, consulte al médico para que la revisen inmediatamente. °· Infórmele al médico si ha tenido trabajo de parto prematuro anteriormente. °Esta información no tiene como fin reemplazar el consejo del médico. Asegúrese de hacerle al médico   cualquier pregunta que tenga. °Document Released: 08/21/2010 Document Revised: 10/27/2016 Document Reviewed: 12/10/2015 °Elsevier Interactive Patient Education © 2018 Elsevier  Inc. ° °

## 2017-08-12 NOTE — Progress Notes (Signed)
Subjective:  Jade MarekCatarina Horn is a 42 y.o. G4P3003 at 2765w6d being seen today for ongoing prenatal care.  She is currently monitored for the following issues for this high-risk pregnancy and has Polio osteopathy of lower leg (HCC); AMA (advanced maternal age) multigravida 35+; Language barrier; and Supervision of other normal pregnancy, antepartum on their problem list.  Patient reports no complaints.  Contractions: Not present. Vag. Bleeding: None.  Movement: Present. Denies leaking of fluid.   The following portions of the patient's history were reviewed and updated as appropriate: allergies, current medications, past family history, past medical history, past social history, past surgical history and problem list. Problem list updated.  Objective:   Vitals:   08/12/17 1430  BP: 104/69  Pulse: 76  Weight: 142 lb (64.4 kg)    Fetal Status: Fetal Heart Rate (bpm): 142 Fundal Height: 28 cm Movement: Present     General:  Alert, oriented and cooperative. Patient is in no acute distress.  Skin: Skin is warm and dry. No rash noted.   Cardiovascular: Normal heart rate noted  Respiratory: Normal respiratory effort, no problems with respiration noted  Abdomen: Soft, gravid, appropriate for gestational age. Pain/Pressure: Absent     Pelvic: Vag. Bleeding: None     Cervical exam deferred        Extremities: Normal range of motion.  Edema: None  Mental Status: Normal mood and affect. Normal behavior. Normal judgment and thought content.   Urinalysis:      Assessment and Plan:  Pregnancy: G4P3003 at 1465w6d  1. Supervision of other normal pregnancy, antepartum Continue routine care. Doing well. Patient not fasting today so will return on Monday for 28wk labs and 2hr-GTT. Received Tdap today. - CBC; Future - RPR; Future - Glucose Tolerance, 2 Hours w/1 Hour; Future - HIV antibody (with reflex); Future  2. Elderly multigravida in second trimester US scheduled.   Preterm labor  symptoms and general obstetric precautions including but not limited to vaginal bleeding, contractions, leaking of fluid and fetal movement were reviewed in detail with the patient. Please refer to After Visit Summary for other counseling recommendations.  Return in about 2 weeks (around 08/26/2017).   Pincus LargePhelps, [name], DO

## 2017-08-12 NOTE — Progress Notes (Signed)
Pt stated fell down Dec 31 and start having upper abdominal pain.

## 2017-08-15 ENCOUNTER — Other Ambulatory Visit: Payer: Self-pay

## 2017-08-15 DIAGNOSIS — Z3480 Encounter for supervision of other normal pregnancy, unspecified trimester: Secondary | ICD-10-CM

## 2017-08-16 ENCOUNTER — Other Ambulatory Visit (HOSPITAL_COMMUNITY): Payer: Self-pay | Admitting: *Deleted

## 2017-08-16 ENCOUNTER — Encounter (HOSPITAL_COMMUNITY): Payer: Self-pay

## 2017-08-16 ENCOUNTER — Ambulatory Visit (HOSPITAL_COMMUNITY)
Admission: RE | Admit: 2017-08-16 | Discharge: 2017-08-16 | Disposition: A | Discharge status: Home / Self Care | Payer: Self-pay | Source: Ambulatory Visit | Attending: Maternal and Fetal Medicine | Admitting: Maternal and Fetal Medicine

## 2017-08-16 ENCOUNTER — Other Ambulatory Visit (HOSPITAL_COMMUNITY): Payer: Self-pay | Admitting: Maternal and Fetal Medicine

## 2017-08-16 DIAGNOSIS — O09522 Supervision of elderly multigravida, second trimester: Secondary | ICD-10-CM

## 2017-08-16 DIAGNOSIS — Z362 Encounter for other antenatal screening follow-up: Secondary | ICD-10-CM

## 2017-08-16 DIAGNOSIS — O09523 Supervision of elderly multigravida, third trimester: Secondary | ICD-10-CM

## 2017-08-16 DIAGNOSIS — Z3A28 28 weeks gestation of pregnancy: Secondary | ICD-10-CM | POA: Insufficient documentation

## 2017-08-16 LAB — CBC
HEMATOCRIT: 31.5 % — AB (ref 34.0–46.6)
HEMOGLOBIN: 9.8 g/dL — AB (ref 11.1–15.9)
MCH: 21.4 pg — ABNORMAL LOW (ref 26.6–33.0)
MCHC: 31.1 g/dL — AB (ref 31.5–35.7)
MCV: 69 fL — ABNORMAL LOW (ref 79–97)
Platelets: 359 10*3/uL (ref 150–379)
RBC: 4.58 x10E6/uL (ref 3.77–5.28)
RDW: 16.4 % — ABNORMAL HIGH (ref 12.3–15.4)
WBC: 7.7 10*3/uL (ref 3.4–10.8)

## 2017-08-16 LAB — GLUCOSE TOLERANCE, 2 HOURS W/ 1HR
GLUCOSE, 1 HOUR: 158 mg/dL (ref 65–179)
GLUCOSE, 2 HOUR: 143 mg/dL (ref 65–152)
GLUCOSE, FASTING: 89 mg/dL (ref 65–91)

## 2017-08-16 LAB — HIV ANTIBODY (ROUTINE TESTING W REFLEX): HIV Screen 4th Generation wRfx: NONREACTIVE

## 2017-08-16 LAB — RPR: RPR: NONREACTIVE

## 2017-08-29 ENCOUNTER — Encounter: Payer: Self-pay | Admitting: Family Medicine

## 2017-09-07 ENCOUNTER — Ambulatory Visit (INDEPENDENT_AMBULATORY_CARE_PROVIDER_SITE_OTHER): Payer: Self-pay | Admitting: Nurse Practitioner

## 2017-09-07 VITALS — BP 110/67 | HR 73 | Wt 143.6 lb

## 2017-09-07 DIAGNOSIS — Z3480 Encounter for supervision of other normal pregnancy, unspecified trimester: Secondary | ICD-10-CM

## 2017-09-07 DIAGNOSIS — O09529 Supervision of elderly multigravida, unspecified trimester: Secondary | ICD-10-CM

## 2017-09-07 NOTE — Progress Notes (Signed)
    Subjective:  Jade Horn is a 42 y.o. G4P3003 at 3512w4d being seen today for ongoing prenatal care.  She is currently monitored for the following issues for this high-risk pregnancy and has Polio osteopathy of lower leg (HCC); AMA (advanced maternal age) multigravida 35+; Language barrier; and Supervision of other normal pregnancy, antepartum on their problem list.  Patient reports no complaints.  Contractions: Irritability. Vag. Bleeding: None.  Movement: Present. Denies leaking of fluid.   The following portions of the patient's history were reviewed and updated as appropriate: allergies, current medications, past family history, past medical history, past social history, past surgical history and problem list. Problem list updated.  Objective:   Vitals:   09/07/17 0919  BP: 110/67  Pulse: 73  Weight: 143 lb 9.6 oz (65.1 kg)    Fetal Status: Fetal Heart Rate (bpm): 159 Fundal Height: 32 cm Movement: Present     General:  Alert, oriented and cooperative. Patient is in no acute distress.  Skin: Skin is warm and dry. No rash noted.   Cardiovascular: Normal heart rate noted  Respiratory: Normal respiratory effort, no problems with respiration noted  Abdomen: Soft, gravid, appropriate for gestational age. Pain/Pressure: Present     Pelvic:  Cervical exam deferred        Extremities: Normal range of motion.  Edema: None  Mental Status: Normal mood and affect. Normal behavior. Normal judgment and thought content.   Urinalysis:      Assessment and Plan:  Pregnancy: G4P3003 at 8512w4d  1. Supervision of other normal pregnancy, antepartum Interpreter via video used for visit, no complaints Plans Nexplanon for postpartum contraception  2. Antepartum multigravida of advanced maternal age Begin NST/BPP at 2736 weeks  Preterm labor symptoms and general obstetric precautions including but not limited to vaginal bleeding, contractions, leaking of fluid and fetal movement were  reviewed in detail with the patient. Please refer to After Visit Summary for other counseling recommendations.  Return in about 2 weeks (around 09/21/2017).  Nolene BernheimERRI Tekia Waterbury, RN, MSN, NP-BC Nurse Practitioner, [facility], [facility] 09/07/2017 10:33 AM

## 2017-09-22 ENCOUNTER — Encounter: Payer: Self-pay | Admitting: Student

## 2017-09-27 ENCOUNTER — Encounter (HOSPITAL_COMMUNITY): Payer: Self-pay

## 2017-09-27 ENCOUNTER — Ambulatory Visit (HOSPITAL_COMMUNITY)
Admission: RE | Admit: 2017-09-27 | Discharge: 2017-09-27 | Disposition: A | Discharge status: Home / Self Care | Payer: Self-pay | Source: Ambulatory Visit | Attending: Family Medicine | Admitting: Family Medicine

## 2017-09-27 ENCOUNTER — Other Ambulatory Visit (HOSPITAL_COMMUNITY): Payer: Self-pay | Admitting: Obstetrics and Gynecology

## 2017-09-27 DIAGNOSIS — Z3A34 34 weeks gestation of pregnancy: Secondary | ICD-10-CM

## 2017-09-27 DIAGNOSIS — Z362 Encounter for other antenatal screening follow-up: Secondary | ICD-10-CM

## 2017-09-27 DIAGNOSIS — O09523 Supervision of elderly multigravida, third trimester: Secondary | ICD-10-CM

## 2017-10-06 ENCOUNTER — Encounter: Payer: Self-pay | Admitting: Student

## 2017-10-13 ENCOUNTER — Encounter: Payer: Self-pay | Admitting: Obstetrics & Gynecology

## 2017-10-13 ENCOUNTER — Telehealth: Payer: Self-pay | Admitting: *Deleted

## 2017-10-13 ENCOUNTER — Other Ambulatory Visit: Payer: Self-pay

## 2017-10-13 NOTE — Telephone Encounter (Signed)
I requested via e-mail to the [employer] that they provide an Accu Chek Guide meter to replace the meter she lost. Meter provided was Lot # [id number] and Expiration of 10/15/2018. Patient did arrive to pick up meter as directed.

## 2017-10-14 IMAGING — US US OB COMP LESS 14 WK
1 series · 15 of 28 positions shown · non-contrast
Comparison: None.

CLINICAL DATA: Vaginal bleeding in first trimester pregnancy.
Threatened abortion. Gestational age by LMP of 12 weeks 0 days

EXAM:
OBSTETRIC <14 WK ULTRASOUND
TECHNIQUE: Transabdominal ultrasound was performed for evaluation of the
gestation as well as the maternal uterus and adnexal regions.

[Series 1: us ob comp less 14 wk · 15 of 65 slices shown]
[im 1/65]
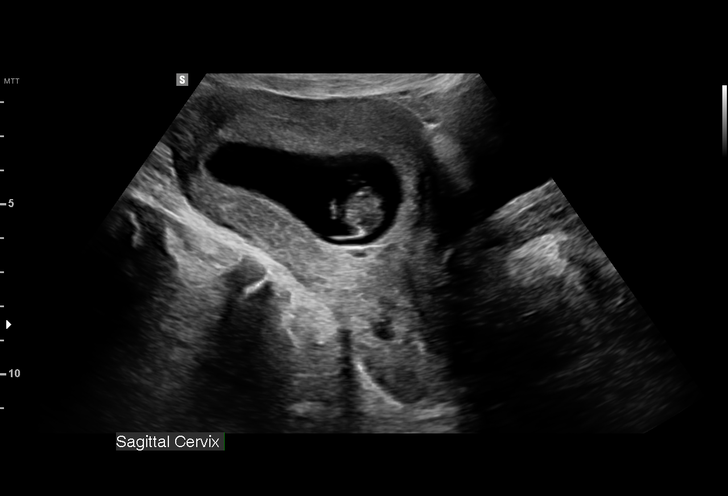
[im 5/65]
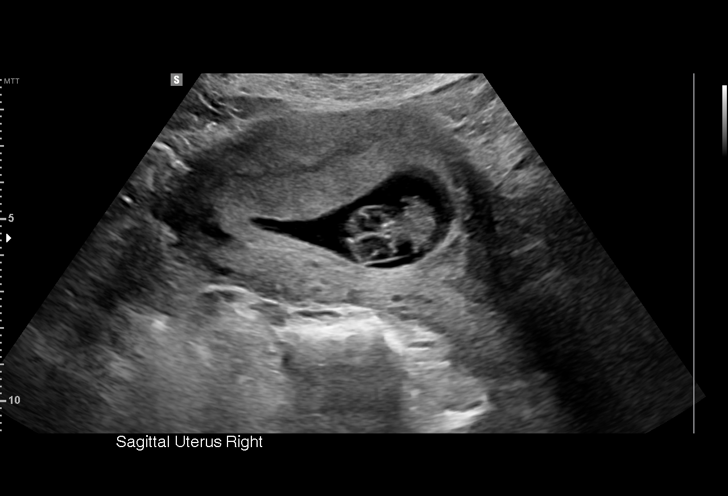
[im 10/65]
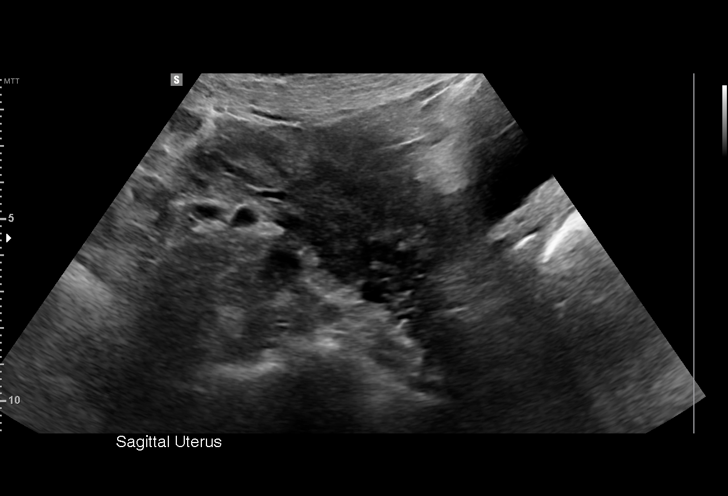
[im 15/65]
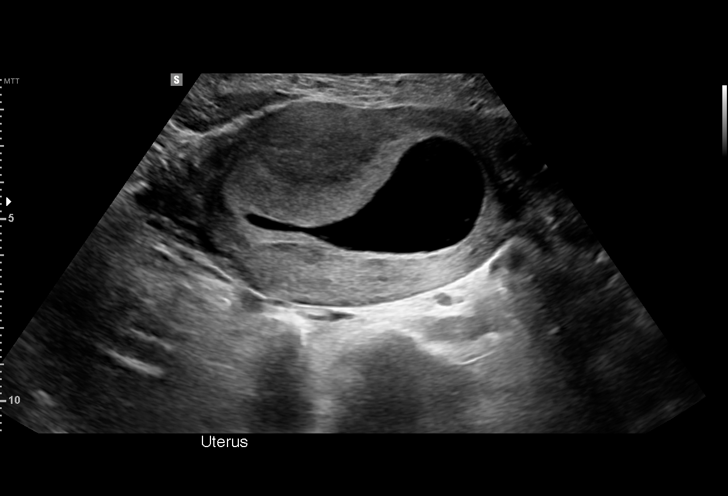
[im 19/65]
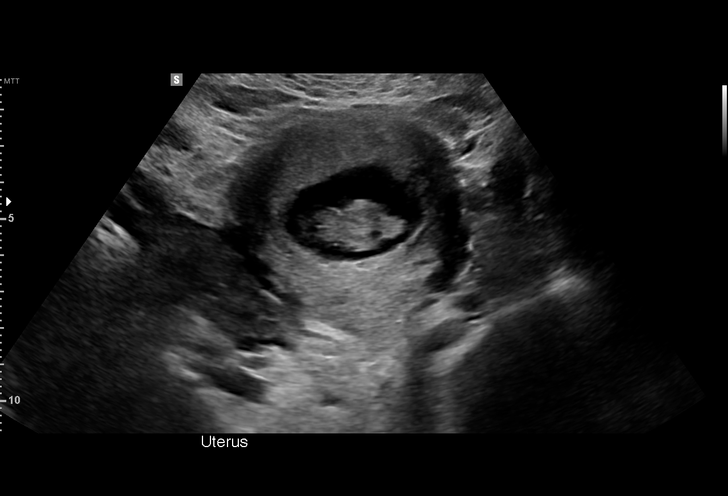
[im 24/65]
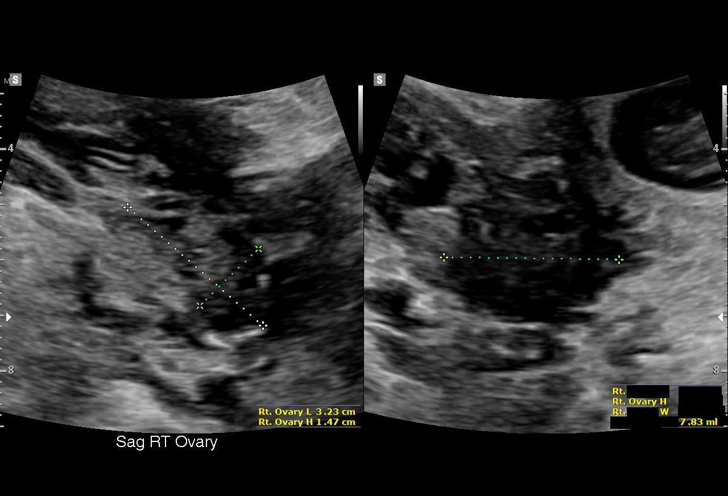
[im 29/65]
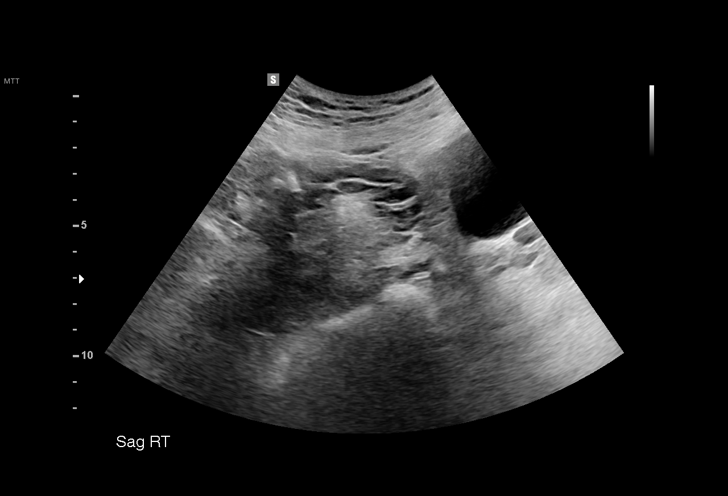
[im 34/65]
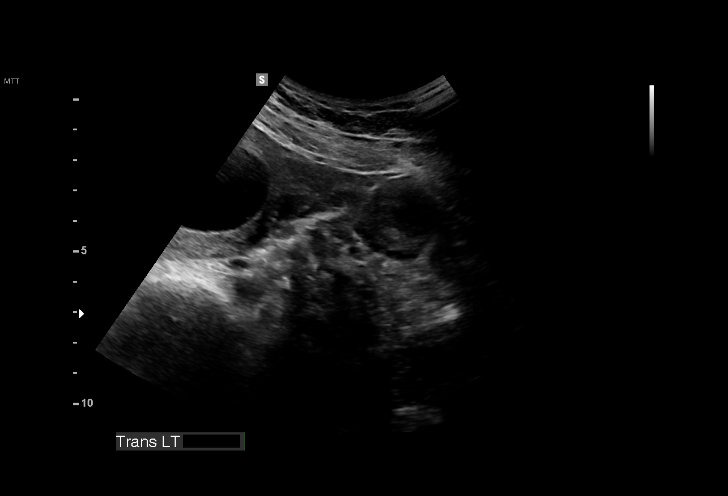
[im 36/65]
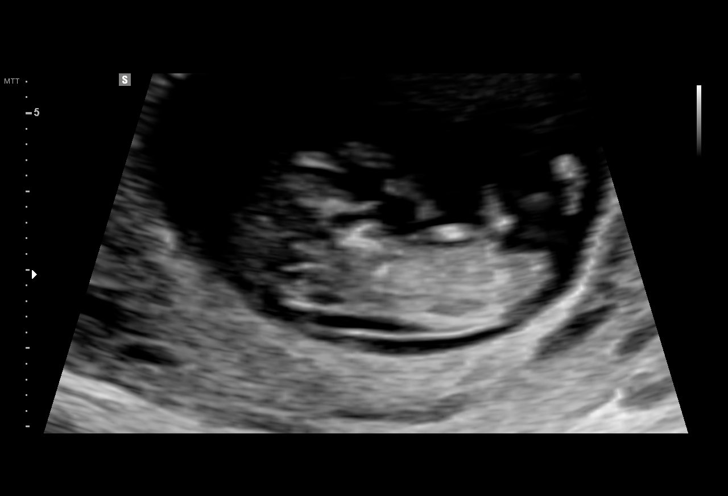
[im 41/65]
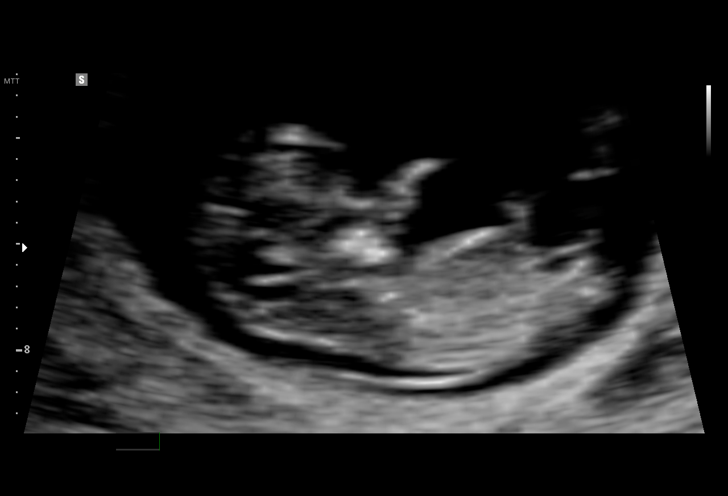
[im 46/65]
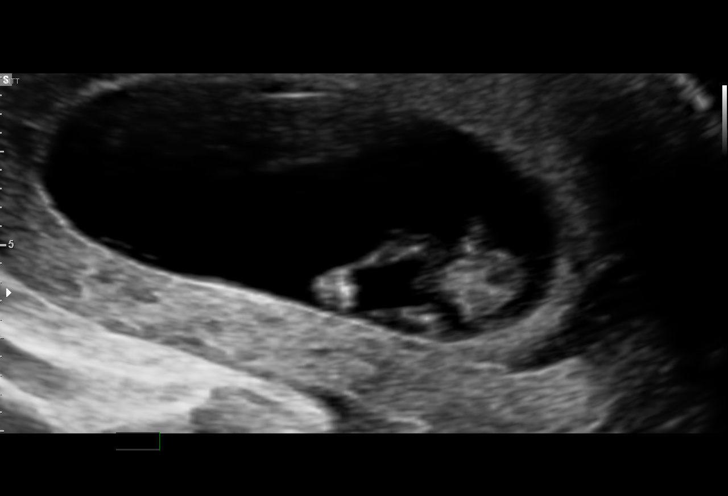
[im 50/65]
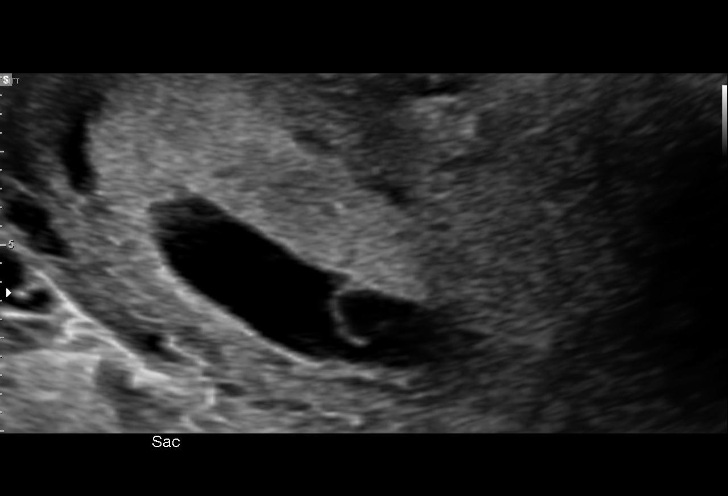
[im 55/65]
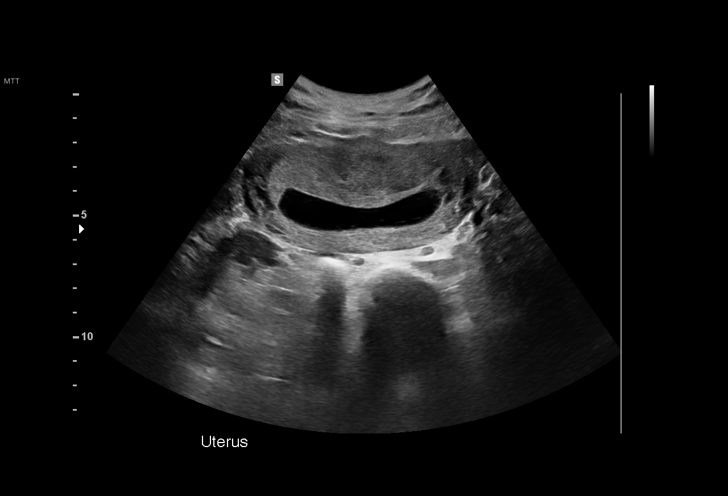
[im 60/65]
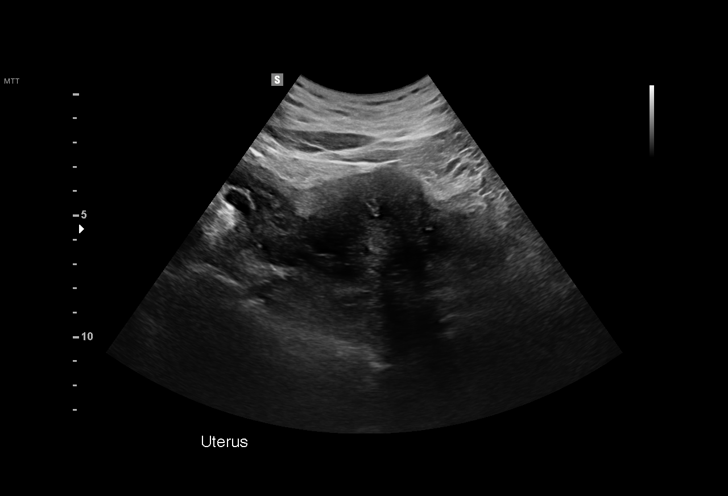
[im 65/65]
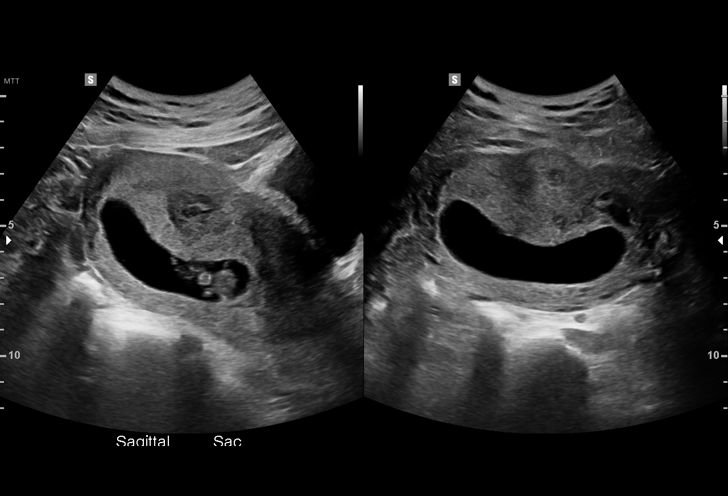

[15 of 28 positions shown; findings below may reference images not displayed]

FINDINGS: Intrauterine gestational sac: Single

Yolk sac:  Visualized

Embryo:  Visualized

Cardiac Activity: Visualized

Heart Rate: 171 bpm

CRL:   42  mm   11 w 0 d                  US EDC: 05/25/2016

Subchorionic hemorrhage:  Small subchorionic hemorrhage noted.

Maternal uterus/adnexae: Probable tiny less than 1 cm fibroid in the
posterior uterine fundus. Both ovaries are normal in appearance. No
adnexal mass or free fluid identified.
IMPRESSION: Single living IUP measuring 11 weeks 0 days with US EDC of
05/25/2016.

Small subchorionic hemorrhage and probable tiny less than 1 cm
posterior uterine fibroid.

## 2017-10-20 ENCOUNTER — Ambulatory Visit: Payer: Self-pay

## 2017-10-20 ENCOUNTER — Ambulatory Visit (INDEPENDENT_AMBULATORY_CARE_PROVIDER_SITE_OTHER): Payer: Self-pay | Admitting: General Practice

## 2017-10-20 ENCOUNTER — Ambulatory Visit (INDEPENDENT_AMBULATORY_CARE_PROVIDER_SITE_OTHER): Payer: Self-pay | Admitting: Family Medicine

## 2017-10-20 VITALS — BP 113/74 | HR 75 | Wt 150.0 lb

## 2017-10-20 DIAGNOSIS — O09523 Supervision of elderly multigravida, third trimester: Secondary | ICD-10-CM

## 2017-10-20 DIAGNOSIS — N898 Other specified noninflammatory disorders of vagina: Secondary | ICD-10-CM

## 2017-10-20 DIAGNOSIS — Z3480 Encounter for supervision of other normal pregnancy, unspecified trimester: Secondary | ICD-10-CM

## 2017-10-20 DIAGNOSIS — Z113 Encounter for screening for infections with a predominantly sexual mode of transmission: Secondary | ICD-10-CM

## 2017-10-20 NOTE — Progress Notes (Signed)
Pt informed that the ultrasound is considered a limited OB ultrasound and is not intended to be a complete ultrasound exam.  Patient also informed that the ultrasound is not being completed with the intent of assessing for fetal or placental anomalies or any pelvic abnormalities.  Explained that the purpose of today's ultrasound is to assess for  BPP, presentation and AFI.  Patient acknowledges the purpose of the exam and the limitations of the study.    

## 2017-10-20 NOTE — Progress Notes (Signed)
    PRENATAL VISIT NOTE Spanish interpreter: Okey RegalCarol used Subjective:  Jade Horn is a 42 y.o. [id number] at 133w5d being seen today for ongoing prenatal care.  She is currently monitored for the following issues for this high-risk pregnancy and has Polio osteopathy of lower leg (HCC); AMA (advanced maternal age) multigravida 35+; Language barrier; and Supervision of other normal pregnancy, antepartum on their problem list.  Patient reports no complaints.  Contractions: Irritability. Vag. Bleeding: None.  Movement: Present. Denies leaking of fluid.   The following portions of the patient's history were reviewed and updated as appropriate: allergies, current medications, past family history, past medical history, past social history, past surgical history and problem list. Problem list updated.  Objective:   Vitals:   10/20/17 0914  BP: 113/74  Pulse: 75  Weight: 150 lb (68 kg)    Fetal Status: Fetal Heart Rate (bpm): NST Fundal Height: 37 cm Movement: Present  Presentation: Vertex  General:  Alert, oriented and cooperative. Patient is in no acute distress.  Skin: Skin is warm and dry. No rash noted.   Cardiovascular: Normal heart rate noted  Respiratory: Normal respiratory effort, no problems with respiration noted  Abdomen: Soft, gravid, appropriate for gestational age.  Pain/Pressure: Present     Pelvic: Cervical exam performed Dilation: 2.5 Effacement (%): 20 Station: -2  Extremities: Normal range of motion.  Edema: None  Mental Status:  Normal mood and affect. Normal behavior. Normal judgment and thought content.  NST:  Baseline: 145 bpm, Variability: Good {> 6 bpm), Accelerations: Reactive and Decelerations: Absent  BPP 8/10 Assessment and Plan:  Pregnancy: G4P3003 at 8833w5d  1. Elderly multigravida in third trimester Normal testing today  2. Supervision of other normal pregnancy, antepartum Cultures today - Culture, beta strep (group b only) - Cervicovaginal  ancillary only  3. Vaginal discharge Check wet prep - Cervicovaginal ancillary only  Term labor symptoms and general obstetric precautions including but not limited to vaginal bleeding, contractions, leaking of fluid and fetal movement were reviewed in detail with the patient. Please refer to After Visit Summary for other counseling recommendations.  Return in 1 week (on 10/27/2017).   Reva Boresanya S Lindee Leason, MD

## 2017-10-20 NOTE — Progress Notes (Signed)
Patient reports occasional dizziness unrelated to position changes. Patient also reports discharge with odor.

## 2017-10-20 NOTE — Patient Instructions (Signed)

## 2017-10-21 LAB — GC/CHLAMYDIA PROBE AMP (~~LOC~~) NOT AT ARMC
CHLAMYDIA, DNA PROBE: NEGATIVE
NEISSERIA GONORRHEA: NEGATIVE

## 2017-10-22 ENCOUNTER — Encounter (HOSPITAL_COMMUNITY): Payer: Self-pay | Admitting: Anesthesiology

## 2017-10-22 ENCOUNTER — Inpatient Hospital Stay (HOSPITAL_COMMUNITY)
Admission: AD | Admit: 2017-10-22 | Discharge: 2017-10-24 | DRG: 807 | Disposition: A | Discharge status: Home / Self Care | Payer: Medicaid Other | Source: Ambulatory Visit | Attending: Obstetrics and Gynecology | Admitting: Obstetrics and Gynecology

## 2017-10-22 ENCOUNTER — Encounter (HOSPITAL_COMMUNITY): Payer: Self-pay | Admitting: *Deleted

## 2017-10-22 DIAGNOSIS — B91 Sequelae of poliomyelitis: Secondary | ICD-10-CM | POA: Diagnosis not present

## 2017-10-22 DIAGNOSIS — Z3483 Encounter for supervision of other normal pregnancy, third trimester: Secondary | ICD-10-CM | POA: Diagnosis present

## 2017-10-22 DIAGNOSIS — Z3A38 38 weeks gestation of pregnancy: Secondary | ICD-10-CM | POA: Diagnosis not present

## 2017-10-22 DIAGNOSIS — M89661 Osteopathy after poliomyelitis, right lower leg: Secondary | ICD-10-CM | POA: Diagnosis present

## 2017-10-22 DIAGNOSIS — O26893 Other specified pregnancy related conditions, third trimester: Secondary | ICD-10-CM | POA: Diagnosis present

## 2017-10-22 DIAGNOSIS — Z3490 Encounter for supervision of normal pregnancy, unspecified, unspecified trimester: Secondary | ICD-10-CM

## 2017-10-22 LAB — CBC
HEMATOCRIT: 31.2 % — AB (ref 36.0–46.0)
HEMOGLOBIN: 9.2 g/dL — AB (ref 12.0–15.0)
MCH: 18.4 pg — ABNORMAL LOW (ref 26.0–34.0)
MCHC: 29.5 g/dL — AB (ref 30.0–36.0)
MCV: 62.4 fL — ABNORMAL LOW (ref 78.0–100.0)
Platelets: 328 10*3/uL (ref 150–400)
RBC: 5 MIL/uL (ref 3.87–5.11)
RDW: 18.7 % — ABNORMAL HIGH (ref 11.5–15.5)
WBC: 10.4 10*3/uL (ref 4.0–10.5)

## 2017-10-22 LAB — POCT FERN TEST: POCT FERN TEST: POSITIVE

## 2017-10-22 LAB — GROUP B STREP BY PCR: GROUP B STREP BY PCR: NEGATIVE

## 2017-10-22 LAB — TYPE AND SCREEN
ABO/RH(D): O POS
Antibody Screen: NEGATIVE

## 2017-10-22 MED ORDER — TERBUTALINE SULFATE 1 MG/ML IJ SOLN
0.2500 mg | Freq: Once | INTRAMUSCULAR | Status: DC | PRN
  Filled 2017-10-22: qty 1

## 2017-10-22 MED ORDER — LACTATED RINGERS IV SOLN
INTRAVENOUS | Status: DC
  Administered 2017-10-22 (×2): via INTRAVENOUS

## 2017-10-22 MED ORDER — SOD CITRATE-CITRIC ACID 500-334 MG/5ML PO SOLN: 30.0000 mL | ORAL | Status: DC | PRN

## 2017-10-22 MED ORDER — LIDOCAINE HCL (PF) 1 % IJ SOLN
30.0000 mL | INTRAMUSCULAR | Status: DC | PRN
  Filled 2017-10-22: qty 30

## 2017-10-22 MED ORDER — OXYTOCIN 40 UNITS IN LACTATED RINGERS INFUSION - SIMPLE MED
2.5000 [IU]/h | INTRAVENOUS | Status: DC
  Administered 2017-10-23: 2.5 [IU]/h via INTRAVENOUS

## 2017-10-22 MED ORDER — PHENYLEPHRINE 40 MCG/ML (10ML) SYRINGE FOR IV PUSH (FOR BLOOD PRESSURE SUPPORT)
80.0000 ug | PREFILLED_SYRINGE | INTRAVENOUS | Status: DC | PRN
  Filled 2017-10-22: qty 5

## 2017-10-22 MED ORDER — OXYTOCIN BOLUS FROM INFUSION
500.0000 mL | Freq: Once | INTRAVENOUS | Status: AC
  Administered 2017-10-23: 500 mL via INTRAVENOUS

## 2017-10-22 MED ORDER — ONDANSETRON HCL 4 MG/2ML IJ SOLN: 4.0000 mg | Freq: Four times a day (QID) | INTRAMUSCULAR | Status: DC | PRN

## 2017-10-22 MED ORDER — EPHEDRINE 5 MG/ML INJ
10.0000 mg | INTRAVENOUS | Status: DC | PRN
  Filled 2017-10-22: qty 2

## 2017-10-22 MED ORDER — OXYTOCIN 40 UNITS IN LACTATED RINGERS INFUSION - SIMPLE MED
1.0000 m[IU]/min | INTRAVENOUS | Status: DC
  Administered 2017-10-22: 2 m[IU]/min via INTRAVENOUS
  Filled 2017-10-22: qty 1000

## 2017-10-22 MED ORDER — ACETAMINOPHEN 325 MG PO TABS: 650.0000 mg | ORAL_TABLET | ORAL | Status: DC | PRN

## 2017-10-22 MED ORDER — FENTANYL 2.5 MCG/ML BUPIVACAINE 1/10 % EPIDURAL INFUSION (WH - ANES)
14.0000 mL/h | INTRAMUSCULAR | Status: DC | PRN
  Filled 2017-10-22: qty 100

## 2017-10-22 MED ORDER — LACTATED RINGERS IV SOLN: 500.0000 mL | INTRAVENOUS | Status: DC | PRN

## 2017-10-22 MED ORDER — PHENYLEPHRINE 40 MCG/ML (10ML) SYRINGE FOR IV PUSH (FOR BLOOD PRESSURE SUPPORT)
80.0000 ug | PREFILLED_SYRINGE | INTRAVENOUS | Status: DC | PRN
  Filled 2017-10-22: qty 5
  Filled 2017-10-22: qty 10

## 2017-10-22 MED ORDER — DIPHENHYDRAMINE HCL 50 MG/ML IJ SOLN: 12.5000 mg | INTRAMUSCULAR | Status: DC | PRN

## 2017-10-22 MED ORDER — LACTATED RINGERS IV SOLN
INTRAVENOUS | Status: DC
  Administered 2017-10-23: via INTRAUTERINE

## 2017-10-22 MED ORDER — LACTATED RINGERS IV SOLN
500.0000 mL | Freq: Once | INTRAVENOUS | Status: AC
  Administered 2017-10-23: 01:00:00 via INTRAVENOUS

## 2017-10-22 NOTE — H&P (Signed)
Obstetric History and Physical  Jade Horn is a 42 y.o. [id number] with IUP at 8126w0d presenting for SROM around 1830. Patient states she has been having regular contractions, none vaginal bleeding, ruptured, clear fluid membranes, with active fetal movement.  No blurry vision, headaches or peripheral edema, and RUQ pain.   Prenatal Course Source of Care: [facility]  with onset of care at 12.3 weeks Dating: By 10wk US --->  Estimated Date of Delivery: 11/05/17 Pregnancy complications or risks: Patient Active Problem List   Diagnosis Date Noted  . Supervision of other normal pregnancy, antepartum 04/26/2017  . AMA (advanced maternal age) multigravida 35+ 11/25/2015  . Language barrier 11/25/2015  . Polio osteopathy of lower leg (HCC) 02/02/2012   She plans to breastfeed She desires Nexplanon for postpartum contraception.   Sono:    @[redacted]w[redacted]d , CWD, normal anatomy, cephalic presentation, anterior placenta, 2515g, 67% EFW  Prenatal labs and studies: ABO, Rh: O/Positive/-- (09/25 0901) Antibody: Negative (09/25 0901) Rubella: 1.78 (09/25 0901) RPR: Non Reactive (01/14 1111)  HBsAg: Negative (09/25 0901)  HIV: Non Reactive (01/14 1111)  GBS: Unknown 2 hr Glucola  normal Genetic screening too late Anatomy US normal  Prenatal Transfer Tool  Maternal Diabetes: No Genetic Screening: Declined Maternal Ultrasounds/Referrals: Normal Fetal Ultrasounds or other Referrals:  None Maternal Substance Abuse:  No Significant Maternal Medications:  None Significant Maternal Lab Results: Lab values include: Other: GBS unknown  Past Medical History:  Diagnosis Date  . Polio osteopathy of lower leg (HCC)     Past Surgical History:  Procedure Laterality Date  . LEG SURGERY     s/p Polio- 5 surgeries for bone  . LEG SURGERY Right    6 times right leg and foot, due to congenital abnormality    OB History  Gravida Para Term Preterm AB Living  4 3 3  0 0 3  SAB TAB Ectopic Multiple Live  Births  0 0 0 0 3    # Outcome Date GA Lbr Len/2nd Weight Sex Delivery Anes PTL Lv  4 Current           3 Term 05/17/16 5936w6d 08:56 / 00:26 3.445 kg (7 lb 9.5 oz) F Vag-Spont EPI  LIV  2 Term 02/02/11   3.175 kg (7 lb) F Vag-Spont   LIV  1 Term 12/07/07   3.175 kg (7 lb)  Vag-Spont  Y LIV    Social History   Socioeconomic History  . Marital status: Single    Spouse name: Not on file  . Number of children: Not on file  . Years of education: Not on file  . Highest education level: Not on file  Occupational History  . Not on file  Social Needs  . Financial resource strain: Not on file  . Food insecurity:    Worry: Not on file    Inability: Not on file  . Transportation needs:    Medical: Not on file    Non-medical: Not on file  Tobacco Use  . Smoking status: Never Smoker  . Smokeless tobacco: Never Used  Substance and Sexual Activity  . Alcohol use: No  . Drug use: No  . Sexual activity: Yes    Birth control/protection: None  Lifestyle  . Physical activity:    Days per week: Not on file    Minutes per session: Not on file  . Stress: Not on file  Relationships  . Social connections:    Talks on phone: Not on file    Gets  together: Not on file    Attends religious service: Not on file    Active member of club or organization: Not on file    Attends meetings of clubs or organizations: Not on file    Relationship status: Not on file  Other Topics Concern  . Not on file  Social History Narrative   ** Merged History Encounter **       Lives with 2 children (girl 1 yo, boy 43 yo), and Blossom Hoops (father of younger child)   Unemployed   Does not drive. 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 Brother lives in town and also has a friend to help.     Family History  Problem Relation Age of Onset  . Hyperlipidemia Father   . Hypertension Father   . Diabetes Father   . Hypertension Mother     Medications Prior to Admission  Medication Sig Dispense  Refill Last Dose  . ferrous sulfate 325 (65 FE) MG EC tablet Take 1 tablet (325 mg total) 3 (three) times daily with meals by mouth. 90 tablet 3 Taking  . Prenatal Vit-Fe Fumarate-FA (PRENATAL MULTIVITAMIN) TABS tablet Take 1 tablet by mouth daily at 12 noon.   Taking    No Known Allergies  Review of Systems: Negative except for what is mentioned in HPI.  Physical Exam: BP 127/68 (BP Location: Right Arm)   Pulse 85   Temp 98.6 F (37 C)   Resp 18   Ht 4\' 10"  (1.473 m)   Wt 67.6 kg (149 lb)   BMI 31.14 kg/m  CONSTITUTIONAL: Well-developed, well-nourished female in no acute distress.  HENT:  Normocephalic, atraumatic, External right and left ear normal. Oropharynx is clear and moist EYES: Conjunctivae and EOM are normal. Pupils are equal, round, and reactive to light. No scleral icterus.  NECK: Normal range of motion, supple, no masses SKIN: Skin is warm and dry. No rash noted. Not diaphoretic. No erythema. No pallor. NEUROLOGIC: Alert and oriented to person, place, and time. Normal reflexes, muscle tone coordination. No cranial nerve deficit noted. PSYCHIATRIC: Normal mood and affect. Normal behavior. Normal judgment and thought content. CARDIOVASCULAR: Normal heart rate noted, regular rhythm RESPIRATORY: Effort and breath sounds normal, no problems with respiration noted ABDOMEN: Soft, nontender, nondistended, gravid. MUSCULOSKELETAL: Normal range of motion. No edema and no tenderness. 2+ distal pulses.  Cervical Exam: Dilation: 2.5 Effacement (%): 50 Station: -2 Presentation: Vertex  FHT:  Baseline rate 150 bpm   Variability moderate  Accelerations present   Decelerations none Contractions: Every 2-3 mins   Pertinent Labs/Studies:   Results for orders placed or performed during the hospital encounter of 10/22/17 (from the past 24 hour(s))  POCT fern test     Status: Abnormal   Collection Time: 10/22/17  8:26 PM  Result Value Ref Range   POCT Fern Test Positive = ruptured  amniotic membanes     Assessment : Jade Horn is a 42 y.o. G4P3003 at [redacted]w[redacted]d being admitted for SROM.  Plan: Labor: Expectant management. Will give patient time to see if she goes into labor. Will augment as needed.  Analgesia as needed per maternal request. FWB: Reassuring fetal heart tracing.  GBS unknown - will collect PCR and treat as indicated. Delivery plan: Hopeful for vaginal delivery   Caryl Ada, DO OB Fellow Faculty Practice, [facility] [place] 10/22/2017, 9:02 PM

## 2017-10-22 NOTE — MAU Note (Signed)
Gush of fld at 1830 which was clear and cont to leak fld. Some pelvic pressure.

## 2017-10-22 NOTE — Progress Notes (Signed)
Labor Progress Note Jade MarekCatarina Horn is a 42 y.o. [id number] at 1817w0d presented for SROM  S: Patient doing well, no questions or concerns. Request to place IUPC for amnioinfusion d/t recurrent variables.  O:  BP 126/77   Pulse 78   Temp 98.4 F (36.9 C) (Oral)   Resp 20   Ht 4\' 10"  (1.473 m)   Wt 149 lb (67.6 kg)   SpO2 98%   BMI 31.14 kg/m   AVW:UJWJXBJYFHT:baseline rate 145, minimal-moderate variability, + acels, recurrent variable decels Toco: ctx every 2-3 min  CVE: Dilation: 4 Effacement (%): 50 Station: -3 Presentation: Vertex Exam by:: Dr. Doroteo GlassmanPhelps  A&P: 42 y.o. [id number] 2617w0d here for SROM. Labor: Progressing well. Placed IUPC for amnioinfusion. Continue titrating Pitocin as needed. Pain: per patient request FWB: Cat II GBS negative  Ellwood DenseAlison Timberlyn Pickford, DO 11:56 PM

## 2017-10-22 NOTE — Anesthesia Preprocedure Evaluation (Deleted)
Anesthesia Evaluation  Patient identified by MRN, date of birth, ID band Patient awake    Reviewed: Allergy & Precautions, NPO status , Patient's Chart, lab work & pertinent test results  Airway        Dental   Pulmonary neg pulmonary ROS,           Cardiovascular negative cardio ROS       Neuro/Psych    GI/Hepatic   Endo/Other    Renal/GU      Musculoskeletal   Abdominal   Peds  Hematology   Anesthesia Other Findings   Reproductive/Obstetrics (+) Pregnancy                             Lab Results  Component Value Date   WBC 10.4 10/22/2017   HGB 9.2 (L) 10/22/2017   HCT 31.2 (L) 10/22/2017   MCV 62.4 (L) 10/22/2017   PLT 328 10/22/2017   Lab Results  Component Value Date   CREATININE 0.4 08/04/2010   BUN <3 (L) 08/04/2010   NA 140 08/04/2010   K 3.2 (L) 08/04/2010   CL 109 08/04/2010     Anesthesia Physical Anesthesia Plan  ASA: II  Anesthesia Plan: Epidural   Post-op Pain Management:    Induction:   PONV Risk Score and Plan:   Airway Management Planned:   Additional Equipment:   Intra-op Plan:   Post-operative Plan:   Informed Consent: I have reviewed the patients History and Physical, chart, labs and discussed the procedure including the risks, benefits and alternatives for the proposed anesthesia with the patient or authorized representative who has indicated his/her understanding and acceptance.     Plan Discussed with: CRNA  Anesthesia Plan Comments:         Anesthesia Quick Evaluation

## 2017-10-23 ENCOUNTER — Encounter (HOSPITAL_COMMUNITY): Payer: Self-pay

## 2017-10-23 DIAGNOSIS — Z3A38 38 weeks gestation of pregnancy: Secondary | ICD-10-CM

## 2017-10-23 LAB — RPR: RPR: NONREACTIVE

## 2017-10-23 MED ORDER — COCONUT OIL OIL: 1.0000 "application " | TOPICAL_OIL | Status: DC | PRN

## 2017-10-23 MED ORDER — SENNOSIDES-DOCUSATE SODIUM 8.6-50 MG PO TABS
2.0000 | ORAL_TABLET | ORAL | Status: DC
  Administered 2017-10-23: 2 via ORAL
  Filled 2017-10-23: qty 2

## 2017-10-23 MED ORDER — PRENATAL MULTIVITAMIN CH
1.0000 | ORAL_TABLET | Freq: Every day | ORAL | Status: DC
  Administered 2017-10-23 – 2017-10-24 (×2): 1 via ORAL
  Filled 2017-10-23 (×2): qty 1

## 2017-10-23 MED ORDER — TETANUS-DIPHTH-ACELL PERTUSSIS 5-2.5-18.5 LF-MCG/0.5 IM SUSP: 0.5000 mL | Freq: Once | INTRAMUSCULAR | Status: DC

## 2017-10-23 MED ORDER — SIMETHICONE 80 MG PO CHEW: 80.0000 mg | CHEWABLE_TABLET | ORAL | Status: DC | PRN

## 2017-10-23 MED ORDER — ZOLPIDEM TARTRATE 5 MG PO TABS: 5.0000 mg | ORAL_TABLET | Freq: Every evening | ORAL | Status: DC | PRN

## 2017-10-23 MED ORDER — ONDANSETRON HCL 4 MG PO TABS: 4.0000 mg | ORAL_TABLET | ORAL | Status: DC | PRN

## 2017-10-23 MED ORDER — ACETAMINOPHEN 325 MG PO TABS: 650.0000 mg | ORAL_TABLET | ORAL | Status: DC | PRN

## 2017-10-23 MED ORDER — BENZOCAINE-MENTHOL 20-0.5 % EX AERO: 1.0000 "application " | INHALATION_SPRAY | CUTANEOUS | Status: DC | PRN

## 2017-10-23 MED ORDER — IBUPROFEN 600 MG PO TABS
600.0000 mg | ORAL_TABLET | Freq: Four times a day (QID) | ORAL | Status: DC
  Administered 2017-10-23 – 2017-10-24 (×6): 600 mg via ORAL
  Filled 2017-10-23 (×6): qty 1

## 2017-10-23 MED ORDER — DIPHENHYDRAMINE HCL 25 MG PO CAPS: 25.0000 mg | ORAL_CAPSULE | Freq: Four times a day (QID) | ORAL | Status: DC | PRN

## 2017-10-23 MED ORDER — DIBUCAINE 1 % RE OINT: 1.0000 "application " | TOPICAL_OINTMENT | RECTAL | Status: DC | PRN

## 2017-10-23 MED ORDER — WITCH HAZEL-GLYCERIN EX PADS: 1.0000 "application " | MEDICATED_PAD | CUTANEOUS | Status: DC | PRN

## 2017-10-23 MED ORDER — ONDANSETRON HCL 4 MG/2ML IJ SOLN: 4.0000 mg | INTRAMUSCULAR | Status: DC | PRN

## 2017-10-23 NOTE — Lactation Note (Signed)
Lactation Consultation Note  Patient Name: Jade MarekCatarina Horn ZOXWR'UToday's Date: 10/23/2017 Reason for consult: Initial assessment   Trinna PostAlex spanish interpreter present.  P4,  Breastfed other child for 2 months each. Mother latched baby in cradle hold.  Provided pillow underneath baby. Intermittent swallows observed but did notice some retractions during feeding. Informed RN. Mom encouraged to feed baby 8-12 times/24 hours and with feeding cues.  Provided mother w/ manual pump and #27 flanges. Mom made aware of O/P services, breastfeeding support groups, community resources, and our phone # for post-discharge questions.    Maternal Data Has patient been taught Hand Expression?: Yes Does the patient have breastfeeding experience prior to this delivery?: Yes  Feeding Feeding Type: Breast Fed  LATCH Score Latch: Grasps breast easily, tongue down, lips flanged, rhythmical sucking.  Audible Swallowing: A few with stimulation  Type of Nipple: Everted at rest and after stimulation  Comfort (Breast/Nipple): Soft / non-tender  Hold (Positioning): Assistance needed to correctly position infant at breast and maintain latch.  LATCH Score: 8  Interventions Interventions: Support pillows;Adjust position;Breast feeding basics reviewed;Hand pump  Lactation Tools Discussed/Used     Consult Status Consult Status: Follow-up Date: 10/24/17 Follow-up type: In-patient    Dahlia ByesBerkelhammer, Yacob Wilkerson [facility] 10/23/2017, 11:50 AM

## 2017-10-24 MED ORDER — IBUPROFEN 600 MG PO TABS: 600.0000 mg | ORAL_TABLET | Freq: Four times a day (QID) | ORAL | 0 refills | Status: AC

## 2017-10-24 NOTE — Discharge Summary (Addendum)
OB Discharge Summary    Patient Name: Jade Horn DOB: 01-01-1976 MRN: [id number]  Date of admission: 10/22/2017 Delivering MD: Jade Horn   Date of discharge: 10/24/2017  Admitting diagnosis: 37.4 WKS, WATER BROKE Intrauterine pregnancy: [redacted]w[redacted]d     Secondary diagnosis:  Active Problems:   Pregnancy   SVD (spontaneous vaginal delivery)  Additional problems: Polio osteopathy of lower leg     Discharge diagnosis: Term Pregnancy Delivered                                                                                                Post partum procedures: None  Augmentation: Pitocin  Complications: None  Hospital course:  Onset of Labor With Vaginal Delivery     42 y.o. yo [id number] at [redacted]w[redacted]d was admitted in Latent Labor on 10/22/2017. Patient had an uncomplicated labor course as follows:  Membrane Rupture Time/Date: 6:30 PM ,10/22/2017   Intrapartum Procedures: Episiotomy: None [1]                                         Lacerations:  None [1]  Patient had a delivery of a Viable infant. 10/23/2017  Information for the patient's newborn:  Jade, Horn Girl Jade [[id number]]  Delivery Method: Vaginal, Spontaneous(Filed from Delivery Summary)    Pateint had an uncomplicated postpartum course.  She is ambulating, tolerating a regular diet, passing flatus, and urinating well. Patient is discharged home in stable condition on 10/24/17.   Physical exam  Vitals:   10/23/17 0423 10/23/17 0840 10/23/17 1900 10/24/17 0529  BP: (!) 99/51 120/70 97/78 104/66  Pulse: 63 66 78 73  Resp: 18 16 20 18   Temp: 98.9 F (37.2 C) 98.6 F (37 C) 98.4 F (36.9 C) 98.7 F (37.1 C)  TempSrc: Oral Oral Oral Oral  SpO2:      Weight:      Height:       General: alert, cooperative and no distress Lochia: appropriate Uterine Fundus: firm Incision: N/A DVT Evaluation: No evidence of DVT seen on physical exam. Labs: Lab Results  Component Value Date   WBC 10.4 10/22/2017    HGB 9.2 (L) 10/22/2017   HCT 31.2 (L) 10/22/2017   MCV 62.4 (L) 10/22/2017   PLT 328 10/22/2017   CMP Latest Ref Rng & Units 12/09/2015  Glucose 65 - 104 mg/dL 88  BUN 6 - 23 mg/dL -  Creatinine 0.4 - 1.2 mg/dL -  Sodium 130 - 865 mEq/L -  Potassium 3.5 - 5.1 mEq/L -  Chloride 96 - 112 mEq/L -    Discharge instruction: per After Visit Summary and "[name]".  After visit meds:  Allergies as of 10/24/2017   No Known Allergies     Medication List    TAKE these medications   ferrous sulfate 325 (65 FE) MG EC tablet Take 1 tablet (325 mg total) 3 (three) times daily with meals by mouth.   ibuprofen 600 MG tablet Commonly known as:  ADVIL,MOTRIN Take 1  tablet (600 mg total) by mouth every 6 (six) hours.   prenatal multivitamin Tabs tablet Take 1 tablet by mouth daily at 12 noon.       Diet: routine diet  Activity: Advance as tolerated. Pelvic rest for 6 weeks.   Outpatient follow up:4-6 weeks Follow up Appt:No future appointments. Follow up Visit: Follow-up Information    [facility]   Specialty:  Obstetrics and Gynecology Why:  in 4 weeks Contact information: [address] [place] [id number] [phone]         Postpartum contraception: Nexplanon  Newborn Data: Live born female  Birth Weight: 6 lb 4.5 oz (2850 g) APGAR: 9, 9  Newborn Delivery   Birth date/time:  10/23/2017 01:22:00 Delivery type:  Vaginal, Spontaneous     Baby Feeding: Breast Disposition:home with mother   10/24/2017 Jade DenseAlison Rumball, DO  OB FELLOW DISCHARGE ATTESTATION  I have seen and examined this patient. I agree with above documentation and have made edits as needed.   Jade AdaJazma Letishia Elliott, DO OB Fellow 11:47 AM

## 2017-10-24 NOTE — Discharge Instructions (Signed)
Vaginal Delivery, Care After °Refer to this sheet in the next few weeks. These instructions provide you with information about caring for yourself after vaginal delivery. Your health care provider may also give you more specific instructions. Your treatment has been planned according to current medical practices, but problems sometimes occur. Call your health care provider if you have any problems or questions. °What can I expect after the procedure? °After vaginal delivery, it is common to have: °· Some bleeding from your vagina. °· Soreness in your abdomen, your vagina, and the area of skin between your vaginal opening and your anus (perineum). °· Pelvic cramps. °· Fatigue. ° °Follow these instructions at home: °Medicines °· Take over-the-counter and prescription medicines only as told by your health care provider. °· If you were prescribed an antibiotic medicine, take it as told by your health care provider. Do not stop taking the antibiotic until it is finished. °Driving ° °· Do not drive or operate heavy machinery while taking prescription pain medicine. °· Do not drive for 24 hours if you received a sedative. °Lifestyle °· Do not drink alcohol. This is especially important if you are breastfeeding or taking medicine to relieve pain. °· Do not use tobacco products, including cigarettes, chewing tobacco, or e-cigarettes. If you need help quitting, ask your health care provider. °Eating and drinking °· Drink at least 8 eight-ounce glasses of water every day unless you are told not to by your health care provider. If you choose to breastfeed your baby, you may need to drink more water than this. °· Eat high-fiber foods every day. These foods may help prevent or relieve constipation. High-fiber foods include: °? Whole grain cereals and breads. °? Brown rice. °? Beans. °? Fresh fruits and vegetables. °Activity °· Return to your normal activities as told by your health care provider. Ask your health care provider  what activities are safe for you. °· Rest as much as possible. Try to rest or take a nap when your baby is sleeping. °· Do not lift anything that is heavier than your baby or 10 lb (4.5 kg) until your health care provider says that it is safe. °· Talk with your health care provider about when you can engage in sexual activity. This may depend on your: °? Risk of infection. °? Rate of healing. °? Comfort and desire to engage in sexual activity. °Vaginal Care °· If you have an episiotomy or a vaginal tear, check the area every day for signs of infection. Check for: °? More redness, swelling, or pain. °? More fluid or blood. °? Warmth. °? Pus or a bad smell. °· Do not use tampons or douches until your health care provider says this is safe. °· Watch for any blood clots that may pass from your vagina. These may look like clumps of dark red, brown, or black discharge. °General instructions °· Keep your perineum clean and dry as told by your health care provider. °· Wear loose, comfortable clothing. °· Wipe from front to back when you use the toilet. °· Ask your health care provider if you can shower or take a bath. If you had an episiotomy or a perineal tear during labor and delivery, your health care provider may tell you not to take baths for a certain length of time. °· Wear a bra that supports your breasts and fits you well. °· If possible, have someone help you with household activities and help care for your baby for at least a few days after   you leave the hospital. °· Keep all follow-up visits for you and your baby as told by your health care provider. This is important. °Contact a health care provider if: °· You have: °? Vaginal discharge that has a bad smell. °? Difficulty urinating. °? Pain when urinating. °? A sudden increase or decrease in the frequency of your bowel movements. °? More redness, swelling, or pain around your episiotomy or vaginal tear. °? More fluid or blood coming from your episiotomy or  vaginal tear. °? Pus or a bad smell coming from your episiotomy or vaginal tear. °? A fever. °? A rash. °? Little or no interest in activities you used to enjoy. °? Questions about caring for yourself or your baby. °· Your episiotomy or vaginal tear feels warm to the touch. °· Your episiotomy or vaginal tear is separating or does not appear to be healing. °· Your breasts are painful, hard, or turn red. °· You feel unusually sad or worried. °· You feel nauseous or you vomit. °· You pass large blood clots from your vagina. If you pass a blood clot from your vagina, save it to show to your health care provider. Do not flush blood clots down the toilet without having your health care provider look at them. °· You urinate more than usual. °· You are dizzy or light-headed. °· You have not breastfed at all and you have not had a menstrual period for 12 weeks after delivery. °· You have stopped breastfeeding and you have not had a menstrual period for 12 weeks after you stopped breastfeeding. °Get help right away if: °· You have: °? Pain that does not go away or does not get better with medicine. °? Chest pain. °? Difficulty breathing. °? Blurred vision or spots in your vision. °? Thoughts about hurting yourself or your baby. °· You develop pain in your abdomen or in one of your legs. °· You develop a severe headache. °· You faint. °· You bleed from your vagina so much that you fill two sanitary pads in one hour. °This information is not intended to replace advice given to you by your health care provider. Make sure you discuss any questions you have with your health care provider. °Document Released: 07/16/2000 Document Revised: 12/31/2015 Document Reviewed: 08/03/2015 °Elsevier Interactive Patient Education © 2018 [employer] ° °

## 2017-10-27 ENCOUNTER — Encounter: Payer: Self-pay | Admitting: Obstetrics and Gynecology

## 2017-10-27 ENCOUNTER — Other Ambulatory Visit: Payer: Self-pay

## 2017-11-03 ENCOUNTER — Other Ambulatory Visit: Payer: Self-pay

## 2017-11-03 ENCOUNTER — Encounter: Payer: Self-pay | Admitting: Family Medicine

## 2017-12-01 ENCOUNTER — Ambulatory Visit: Payer: Self-pay | Admitting: Student

## 2018-02-05 IMAGING — US US MFM OB DETAIL+14 WK
1 series · 13 of 28 positions shown · non-contrast
Comparison: none

[facility]

[facility]
[REDACTED]-
Faculty Physician
1  ULICES [place]           [id number]      [id number]     [id number]
Indications
27 weeks gestation of pregnancy
Detailed fetal anatomic survey                 Z36
Advanced maternal age multigravida 34+,
second trimester
OB History
Gravidity:    3         Term:   2
Living:       2
Fetal Evaluation
Num Of Fetuses:     1
Fetal Heart         134
Rate(bpm):
Cardiac Activity:   Observed
Presentation:       Transverse, head to maternal right
Placenta:           Anterior, above cervical os
P. Cord Insertion:  Visualized
Amniotic Fluid
AFI FV:      Subjectively within normal limits
Largest Pocket(cm)
6.49
Biometry
BPD:      71.8  mm     G. Age:  28w 6d         85  %    CI:        71.36   %   70 - 86
FL/HC:      18.6   %   18.6 -
HC:      270.7  mm     G. Age:  29w 4d         87  %    HC/AC:      1.16       1.05 -
AC:      232.6  mm     G. Age:  27w 4d         52  %    FL/BPD:     70.2   %   71 - 87
FL:       50.4  mm     G. Age:  27w 0d         29  %    FL/AC:      21.7   %   20 - 24
HUM:      45.8  mm     G. Age:  27w 0d         41  %
CER:      32.9  mm     G. Age:  28w 6d         72  %
CM:        8.3  mm
Est. FW:    7775  gm      2 lb 7 oz     60  %
Gestational Age
LMP:           28w 2d       Date:   08/12/15                 EDD:   05/18/16
U/S Today:     28w 2d                                        EDD:   05/18/16
Best:          27w 2d    Det. By:   Early Ultrasound         EDD:   05/25/16
(11/04/15)
Anatomy
Cranium:               Appears normal         LVOT:                   Appears normal
Cavum:                 Appears normal         Aortic Arch:            Appears normal
Ventricles:            Appears normal         Ductal Arch:            Appears normal
Choroid Plexus:        Appears normal         Diaphragm:              Appears normal
Cerebellum:            Appears normal         Stomach:                Appears normal, left
sided
Posterior Fossa:       Appears normal         Abdomen:                Appears normal
Nuchal Fold:           Not applicable (>20    Abdominal Wall:         Not well visualized
wks GA)
Face:                  Orbits appear          Cord Vessels:           Appears normal (3
normal                                         vessel cord)
Lips:                  Appears normal         Kidneys:                Appear normal
Palate:                Appears normal         Bladder:                Appears normal
Thoracic:              Appears normal         Spine:                  Appears normal
Heart:                 Appears normal         Upper Extremities:      Appears normal
(4CH, axis, and
situs)
RVOT:                  Appears normal         Lower Extremities:      Appears normal
Other:  Parents do not wish to know sex of fetus. Heels visualized.
Technically difficult due to fetal position.
Cervix Uterus Adnexa
Cervix
Length:           4.11  cm.
Normal appearance by transabdominal scan.
Uterus
No abnormality visualized.
Left Ovary
Within normal limits.
Right Ovary
Not visualized.
Adnexa:       No abnormality visualized. No adnexal mass
visualized.
Impression
INDICATION: 40 yr old JNCARRA at 12w1d for fetal anatomic
survey.

[Series 1: us mfm ob detail+14 wk · 132 acquisitions, 13 frames shown]
[im 5/132]
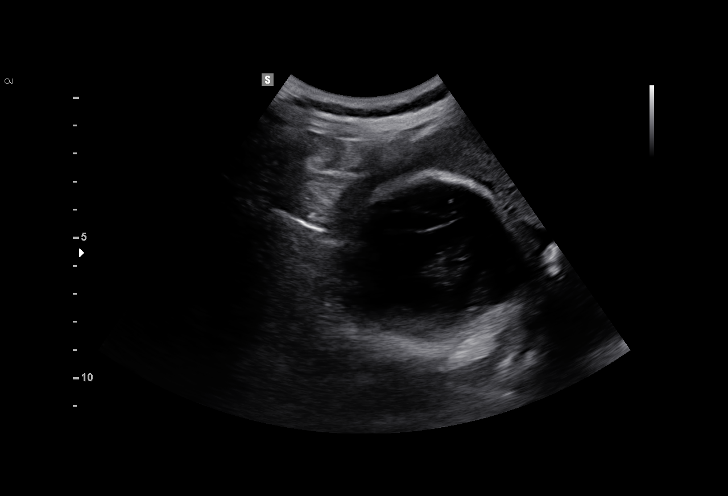
[im 15/132]
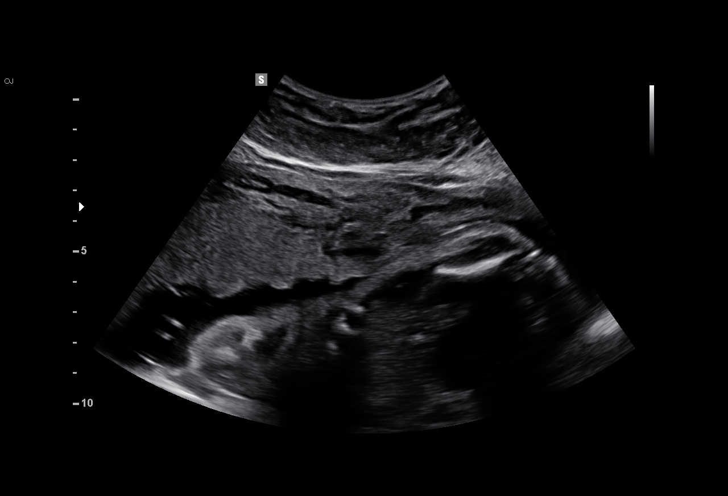
[im 25/132]
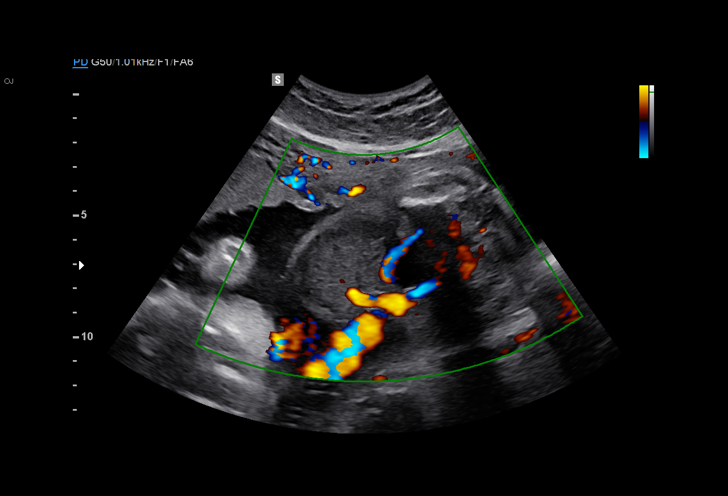
[im 34/132]
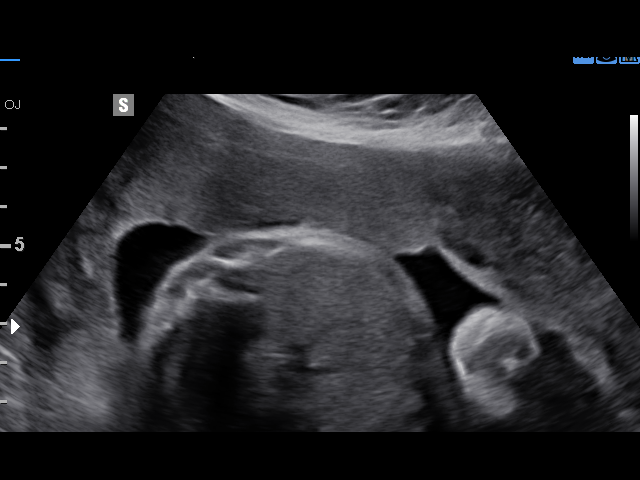
[im 44/132]
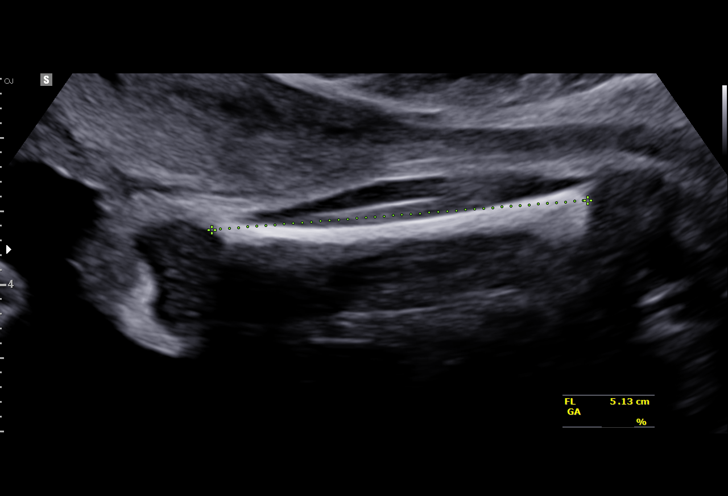
[im 54/132]
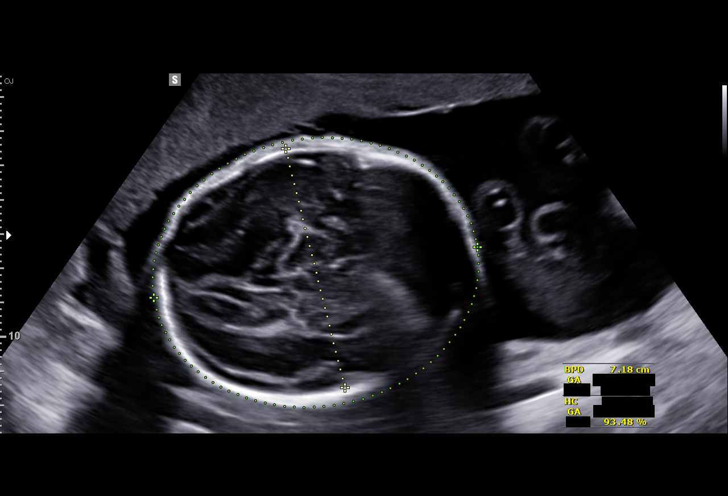
[im 68/132]
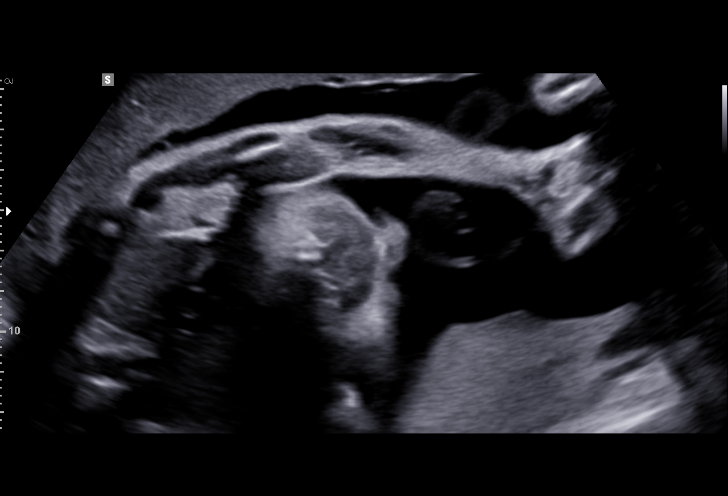
[im 78/132]
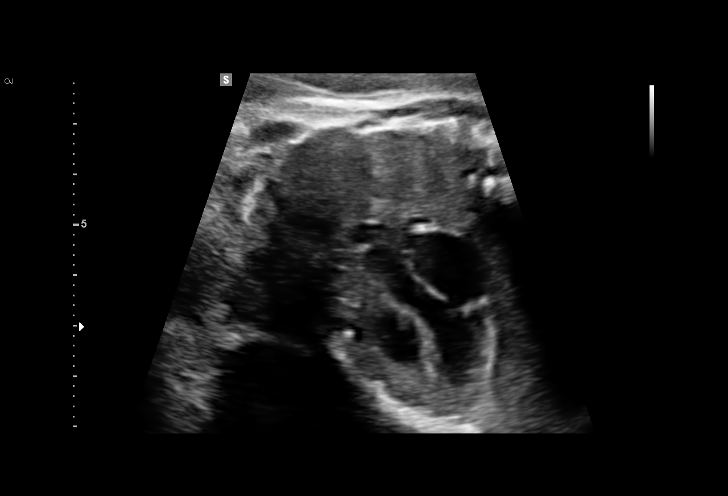
[im 88/132]
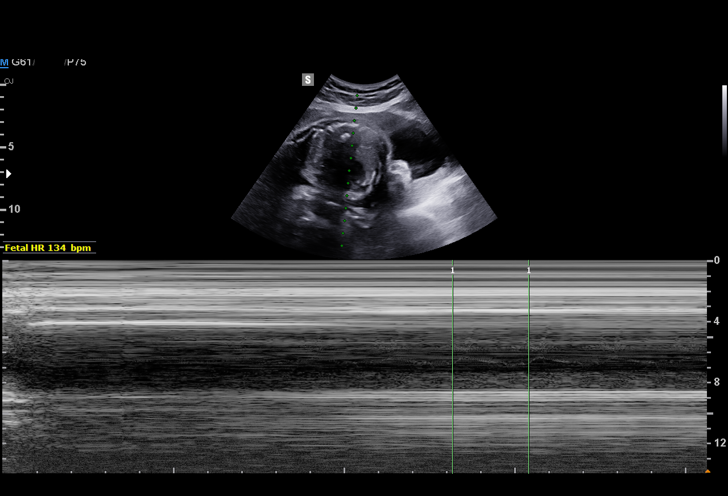
[im 98/132]
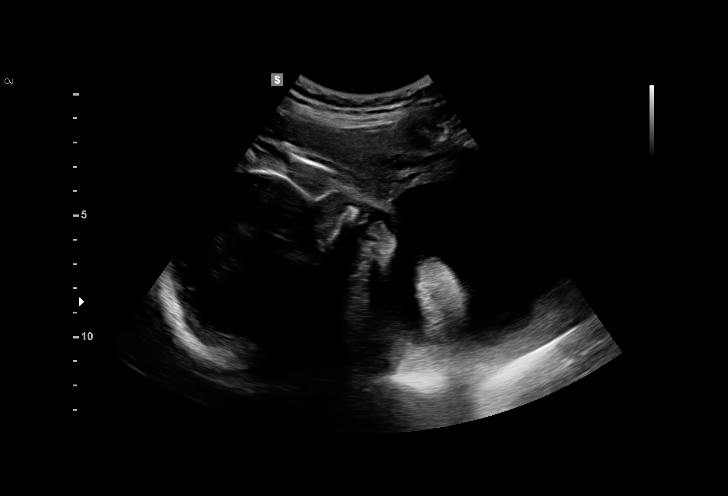
[im 107/132]
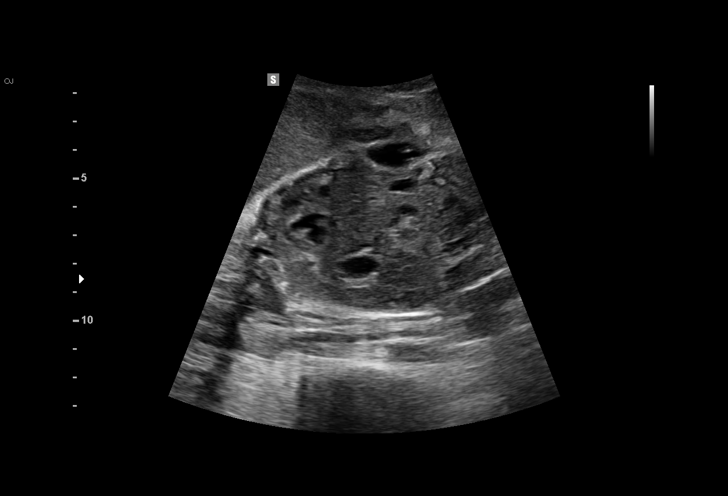
[im 117/132]
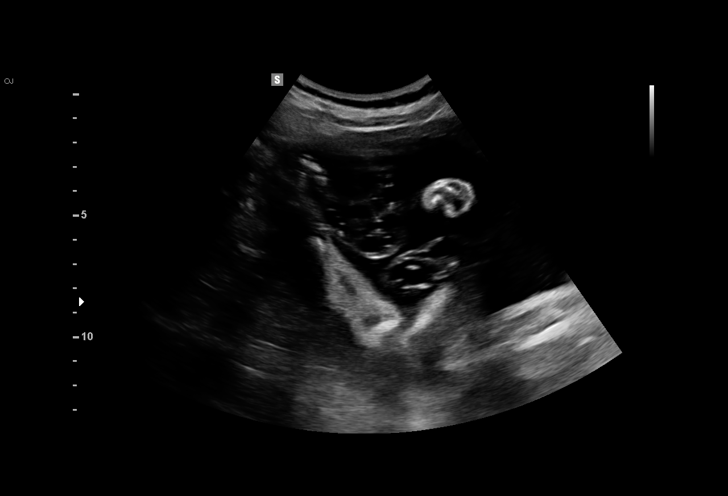
[im 127/132]
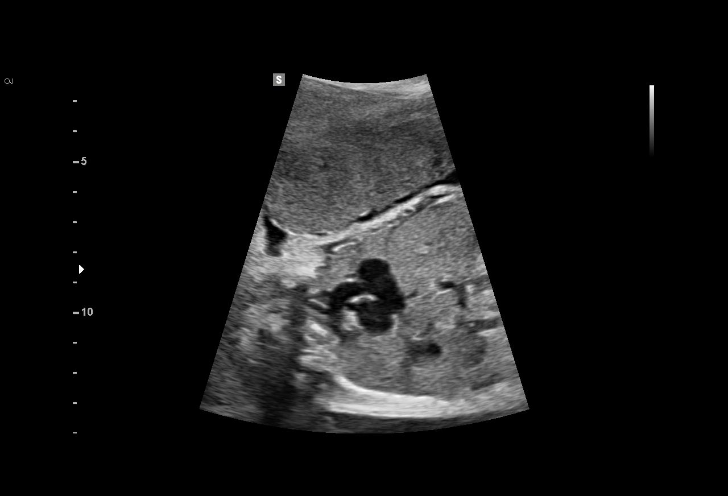

[13 of 28 positions shown; findings below may reference images not displayed]

FINDINGS: 1. Single intrauterine pregnancy.
2. Estimated fetal weight is in the 60th%.
3. Anterior placenta without evidence of previa.
4. Normal amniotic fluid volume.
5. Normal transabdominal cervical length.
6. Normal fetal anatomic survey.
Recommendations

1. Appropriate fetal growth.
2. Normal fetal anatomic survey.
3. Advanced maternal age:
- patient met with the genetic counselor; see separate report
- after counseling patient declined aneuploidy
screening/testing
- recommend fetal growth every 4-6 weeks
- recommend start antenatal testing at 32-36 weeks
- recommend fetal Paulus N Ceejay
- recommend delivery by estimated due date

## 2018-11-30 ENCOUNTER — Encounter: Payer: Self-pay | Admitting: *Deleted

## 2019-09-07 IMAGING — US US MFM OB FOLLOW-UP
1 series · 13 of 28 positions shown · non-contrast
Comparison: none

[Series 1: us mfm ob follow-up · 38 acquisitions, 13 frames shown]
[im 2/38]
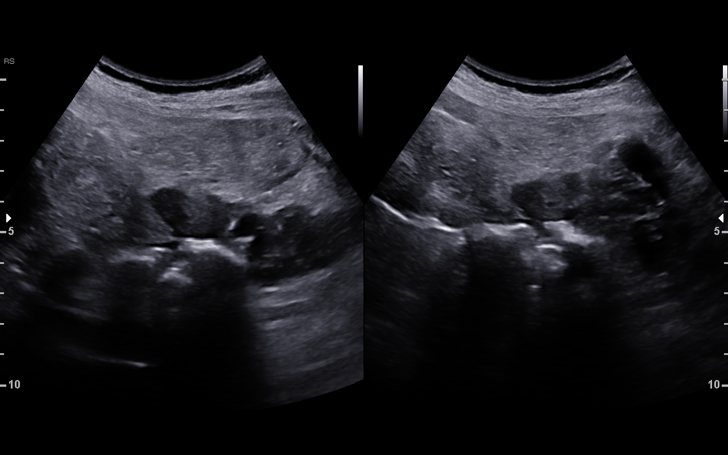
[im 5/38]
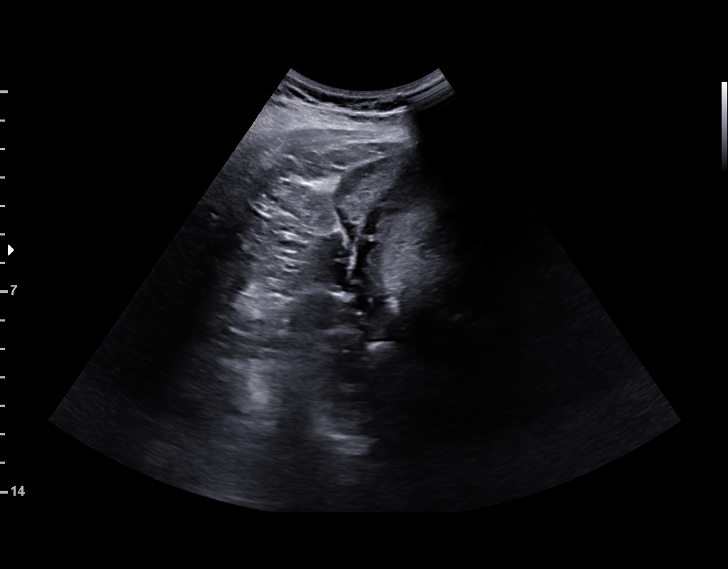
[im 7/38]
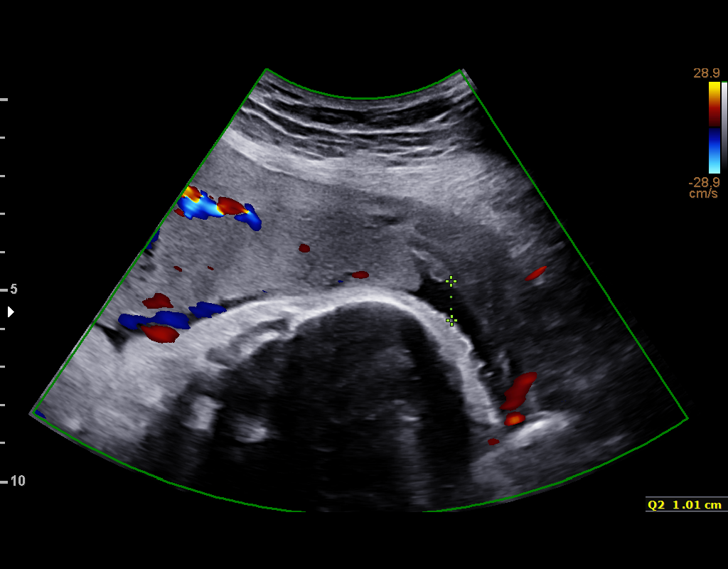
[im 10/38]
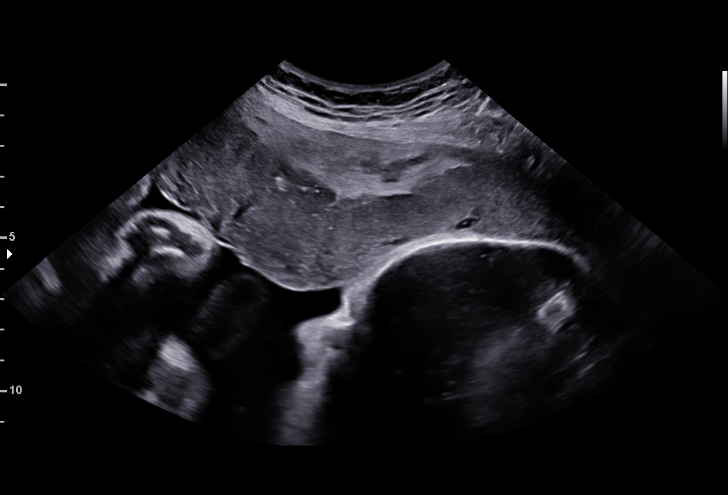
[im 13/38]
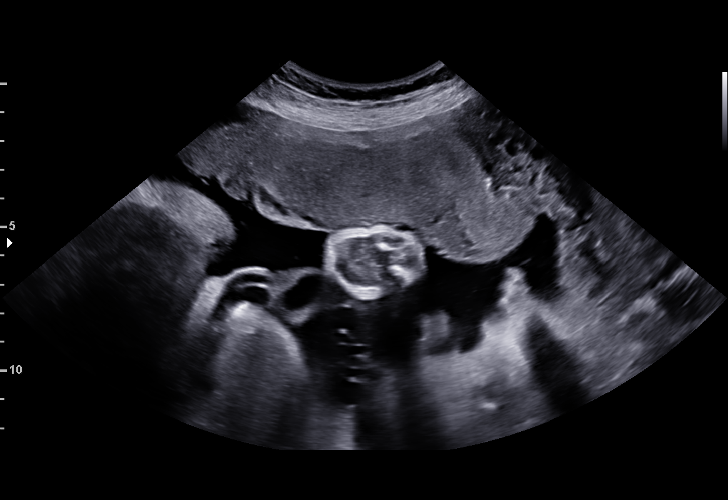
[im 16/38]
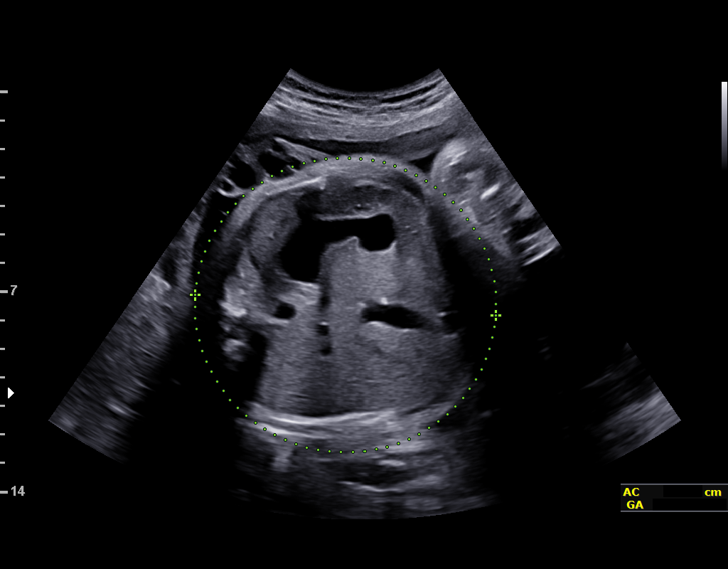
[im 20/38]
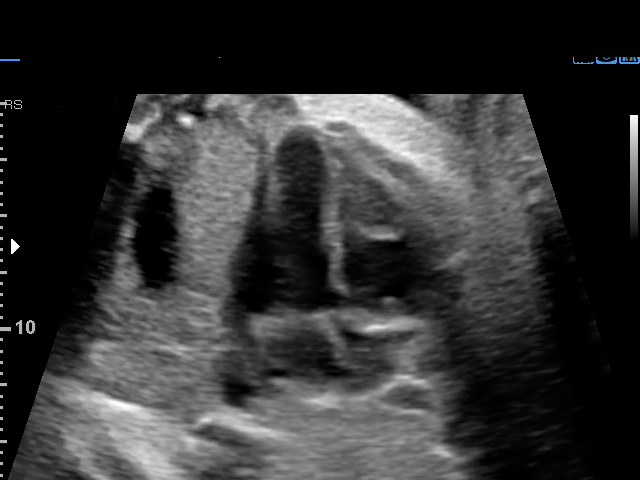
[im 22/38]
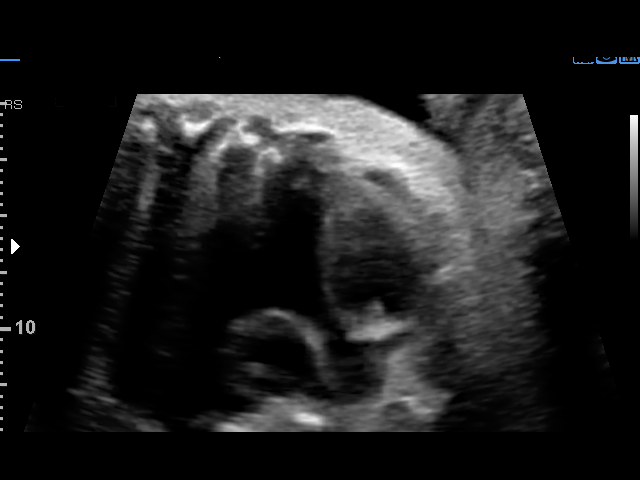
[im 25/38]
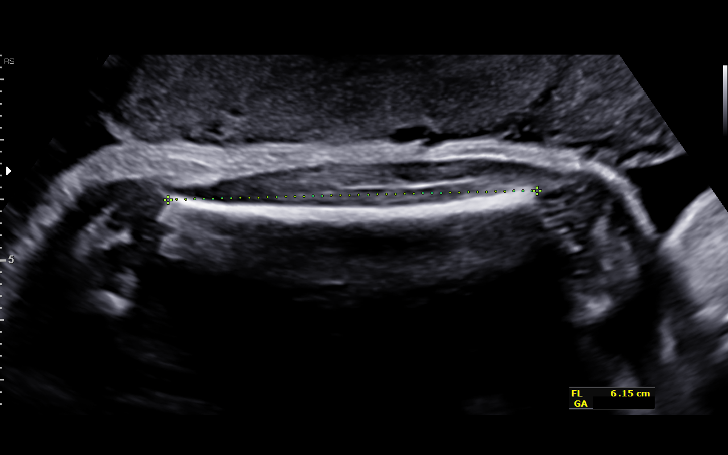
[im 28/38]
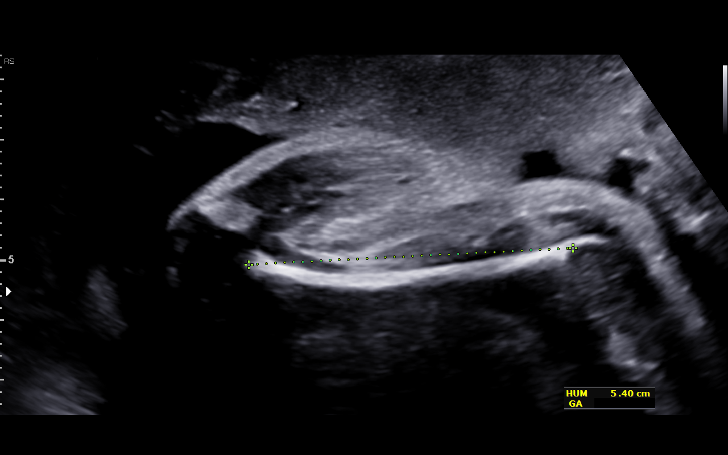
[im 31/38]
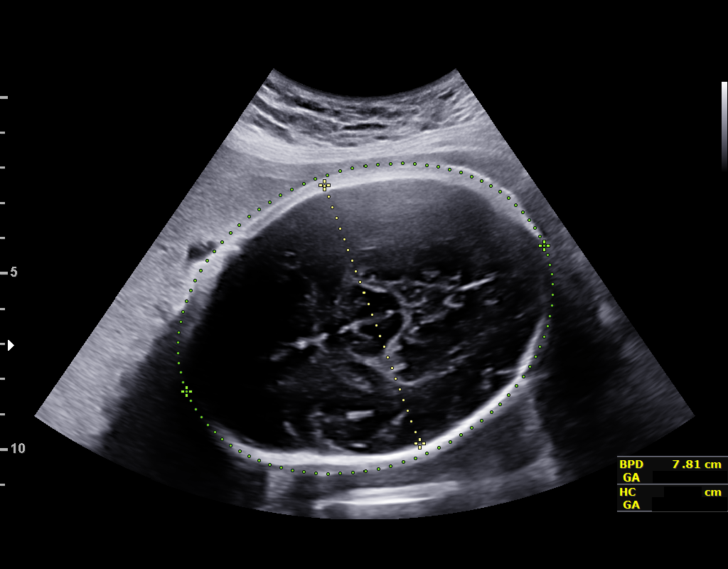
[im 33/38]
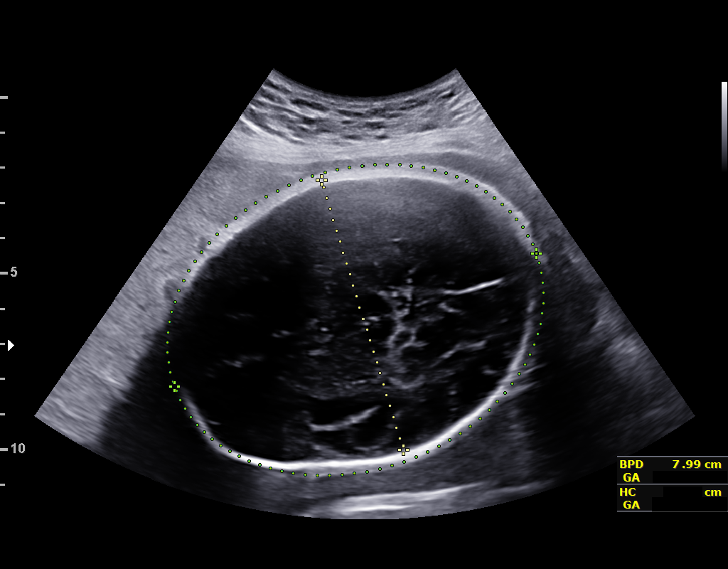
[im 36/38]
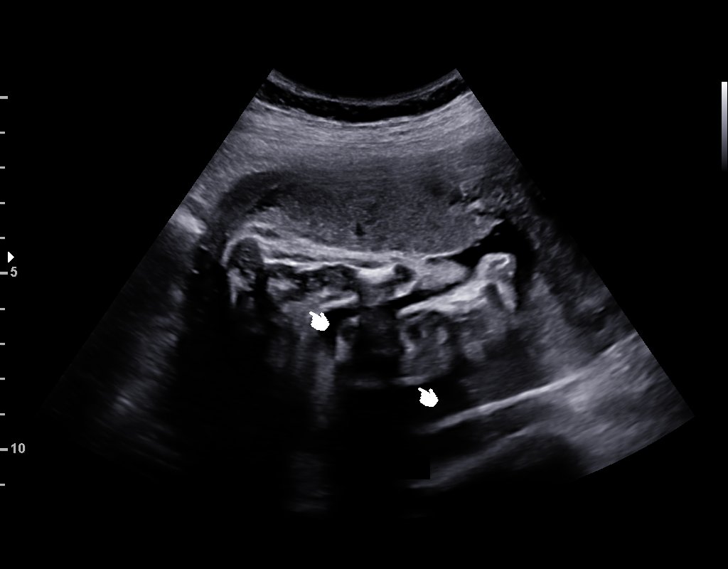

[13 of 28 positions shown; findings below may reference images not displayed]

ZEINAB

[facility] [HOSPITAL]

1  JIBERSON DEH VIBE              [id number]      [id number]     [id number]
Indications

34 weeks gestation of pregnancy
Advanced maternal age multigravida 35+
(41), third trimester, declined testing
Encounter for other antenatal screening
follow-up
OB History

Blood Type:            Height:  4'11"  Weight (lb):  135       BMI:
Gravidity:    4         Term:   3
Living:       3
Fetal Evaluation

Num Of Fetuses:     1
Fetal Heart         140
Rate(bpm):
Cardiac Activity:   Observed
Presentation:       Cephalic
Placenta:           Anterior, above cervical os
P. Cord Insertion:  Previously Visualized

Amniotic Fluid
AFI FV:      Subjectively within normal limits

AFI Sum(cm)     %Tile       Largest Pocket(cm)
13.93           49

RUQ(cm)       RLQ(cm)       LUQ(cm)        LLQ(cm)
7.65
Biometry

BPD:      79.2  mm     G. Age:  31w 5d          2  %    CI:        67.52   %    70 - 86
FL/HC:      19.9   %    19.4 -
HC:      308.5  mm     G. Age:  34w 3d         18  %    HC/AC:      0.94        0.96 -
AC:      329.4  mm     G. Age:  36w 6d       > 97  %    FL/BPD:     77.5   %    71 - 87
FL:       61.4  mm     G. Age:  31w 6d        < 3  %    FL/AC:      18.6   %    20 - 24
HUM:      53.6  mm     G. Age:  31w 1d        < 5  %

Est. FW:    4202  gm      5 lb 9 oz     67  %
Gestational Age

U/S Today:     33w 5d                                        EDD:   11/10/17
Best:          34w 3d     Det. By:  Early Ultrasound         EDD:   11/05/17
(04/12/17)
Anatomy

Cranium:               Appears normal         Aortic Arch:            Previously seen
Cavum:                 Previously seen        Ductal Arch:            Previously seen
Ventricles:            Appears normal         Diaphragm:              Previously seen
Choroid Plexus:        Previously seen        Stomach:                Appears normal, left
sided
Cerebellum:            Previously seen        Abdomen:                Previously seen
Posterior Fossa:       Previously seen        Abdominal Wall:         Previously seen
Nuchal Fold:           Previously seen        Cord Vessels:           Previously seen
Face:                  Orbits and profile     Kidneys:                Appear normal
previously seen
Lips:                  Previously seen        Bladder:                Appears normal
Thoracic:              Appears normal         Spine:                  Previously seen
Heart:                 Appears normal         Upper Extremities:      Previously seen
(4CH, axis, and situs
RVOT:                  Previously seen        Lower Extremities:      Previously seen
LVOT:                  Appears normal

Other:  Female gender. Heels and 5th digit visualized previously. Technically
difficult due to fetal position.
Cervix Uterus Adnexa

Cervix
Not visualized (advanced GA >29wks)

Uterus
No abnormality visualized.

Left Ovary
Within normal limits.

Right Ovary
Not visualized.

Adnexa:       No abnormality visualized. No adnexal mass
visualized.
Impression

SIUP at 34+3 weeks
Cephalic presentation
Normal interval anatomy; anatomic survey complete
Normal amniotic fluid volume
Appropriate interval growth with EFW at the 67th %tile
Recommendations

Agree with testing at 36 weeks

## 2022-03-05 LAB — GLUCOSE, POCT (MANUAL RESULT ENTRY): POC Glucose: 91 mg/dl (ref 70–99)

## 2023-04-05 ENCOUNTER — Ambulatory Visit: Payer: Self-pay | Admitting: Internal Medicine

## 2024-07-10 ENCOUNTER — Encounter: Payer: Self-pay | Admitting: Internal Medicine

## 2024-07-10 ENCOUNTER — Ambulatory Visit: Payer: Self-pay | Admitting: Internal Medicine

## 2024-07-10 VITALS — BP 138/90 | HR 63 | Resp 14 | Ht <= 58 in | Wt 141.0 lb

## 2024-07-10 DIAGNOSIS — Z1231 Encounter for screening mammogram for malignant neoplasm of breast: Secondary | ICD-10-CM

## 2024-07-10 DIAGNOSIS — R030 Elevated blood-pressure reading, without diagnosis of hypertension: Secondary | ICD-10-CM

## 2024-07-10 DIAGNOSIS — Q72811 Congenital shortening of right lower limb: Secondary | ICD-10-CM

## 2024-07-10 DIAGNOSIS — Z23 Encounter for immunization: Secondary | ICD-10-CM

## 2024-07-10 NOTE — Progress Notes (Unsigned)
 SDOH Screenings   Food Insecurity: Food Insecurity Present (07/10/2024)  Housing: Low Risk  (07/10/2024)  Transportation Needs: Unmet Transportation Needs (07/10/2024)  Utilities: Not At Risk (07/10/2024)  Tobacco Use: Low Risk  (07/10/2024)      07/10/2024    2:36 PM 10/20/2017    9:14 AM 08/12/2017    2:55 PM 07/22/2017    8:42 AM  GAD 7 : Generalized Anxiety Score  Nervous, Anxious, on Edge 0 0 0 0  Control/stop worrying 0 0 0 0  Worry too much - different things 0 0 0 0  Trouble relaxing 0 0 0 0  Restless 0 0 0 0  Easily annoyed or irritable 0 0 0 0  Afraid - awful might happen 0 0 0 0  Total GAD 7 Score 0 0 0 0  Anxiety Difficulty Not difficult at all         07/10/2024    2:37 PM 10/20/2017    9:14 AM 08/12/2017    2:55 PM  PHQ9 SCORE ONLY  PHQ-9 Total Score 0 0  0      Data saved with a previous flowsheet row definition    Patient agreed to a community health worker calling to address some of the SDOH needs. She was also informed of the market we have this Thursday. Patient has no needs at this time with the social work department. Dr. Adella was informed.   Camie Norris [place], LCSWA

## 2024-07-10 NOTE — Progress Notes (Unsigned)
 Subjective:    Patient ID: Jade Horn, female   DOB: 02/11/1976, 48 y.o.   MRN: [id number]   HPI  Jade Horn interprets   Elevated BP:  has never been told her bp is high previously.  She does get anxious at the doctor's office at times.    2.  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  Apparently, she had 4 surgeries in her life, some of which seem to have made her foot/ankle strength weaker and unable to wear a shoe.   She gets quite fatigued  She had surgery first at 48 years of age--not clear if attempting to elongate achilles,  Sounds like she had pins in place to gradually elongate her distal femur, but had complications of cutting into gastroc/soleus muscles over time.   Had another surgery lateral to knee as well to cut away bone--she was told later, there was no need to cut the bone.   She has a hard nodular lesion buried in scar from where one of the distal femur pins at lateral aspect.     Last surgery at age 32 yo. 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     No outpatient medications have been marked as taking for the 07/10/24 encounter (Office Visit) with Jade Norris, MD.   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   Family History  Problem Relation Age of Onset   Hypertension Mother    Kidney disease Father        on dialysis from diabetic nephropathy   Hyperlipidemia Father    Hypertension Father    Diabetes Father        complications cause of death   COPD Sister         Throat cancer; no tobacco use   COPD Sister        Throat; no tobacco exposure   COPD Maternal Grandmother        Throat cancer.   Social History   Socioeconomic History   Marital status: Single    Spouse name: Jade Horn   Number of children: 4   Years of education: 2   Highest education level: 2nd grade  Occupational History   Occupation: Housewife  Tobacco Use   Smoking status: Never   Smokeless tobacco: Never  Vaping Use   Vaping status: Never Used  Substance and Sexual Activity   Alcohol use: No   Drug use: No   Sexual activity: Yes    Birth control/protection: None  Other Topics Concern   Not on file  Social History Narrative   Lives at home with her 4 children and husband, Jade Horn (father of younger child)   Social Drivers of Health   Financial Resource Strain: Not on file  Food Insecurity: Food Insecurity Present (07/10/2024)   Hunger Vital Sign    Worried About Running Out of Food in the Last Year: Sometimes true    Ran Out of Food in the Last Year: Sometimes  true  Transportation Needs: Unmet Transportation Needs (07/10/2024)   PRAPARE - Administrator, Civil Service (Medical): Yes    Lack of Transportation (Non-Medical): Yes  Physical Activity: Not on file  Stress: Not on file  Social Connections: Not on file  Intimate Partner Violence: Not At Risk (07/10/2024)   Humiliation, Afraid, Rape, and Kick questionnaire    Fear of Current or Ex-Partner: No    Emotionally Abused: No    Physically Abused: No    Sexually Abused: No      Review of Systems    Objective:   BP (!) 138/90 (BP Location: Left Arm, Patient Position: Sitting, Cuff Size: Normal)   Pulse 63   Resp 14   Ht 4' 9 (1.448 m)   Wt 141 lb (64 kg)   LMP 05/21/2024 (Approximate)   BMI 30.51 kg/m   Physical Exam HENT:     Head: Normocephalic and atraumatic.     Right Ear: Tympanic membrane, ear canal and external ear normal.     Left Ear: Tympanic membrane, ear  canal and external ear normal.     Nose: Nose normal.     Mouth/Throat:     Mouth: Mucous membranes are moist.     Pharynx: Oropharynx is clear.  Eyes:     Extraocular Movements: Extraocular movements intact.     Conjunctiva/sclera: Conjunctivae normal.     Pupils: Pupils are equal, round, and reactive to light.     Comments: Discs sharp  Neck:     Thyroid: No thyroid mass or thyromegaly.  Cardiovascular:     Rate and Rhythm: Normal rate and regular rhythm.     Heart sounds: No murmur heard.    No friction rub.  Pulmonary:     Effort: Pulmonary effort is normal.     Breath sounds: Normal breath sounds.  Abdominal:     General: Bowel sounds are normal.     Palpations: Abdomen is soft. There is no mass.     Tenderness: There is no abdominal tenderness.  Musculoskeletal:     Cervical back: Normal range of motion and neck supple.     Comments: Atrophied and foreshortened right leg with deep scarring at distal anterior thigh, extending laterally about knee.  Knee is bulbous and she is unable to fully straighten Achilles tendon is tight Appears to have full ROM at hip.   Unable to find patellar tendon to attempt reflex. Ankle reflex is 3+/4 Using crutches to move about or hops on left leg.    Skin:    General: Skin is warm.     Findings: No rash.  Neurological:     Mental Status: She is alert and oriented to person, place, and time.      Assessment & Plan  Jade Horn, M.D. [facility].

## 2024-07-20 ENCOUNTER — Other Ambulatory Visit: Payer: Self-pay

## 2024-07-20 VITALS — BP 138/96 | HR 60

## 2024-07-20 DIAGNOSIS — Z0130 Encounter for examination of blood pressure without abnormal findings: Secondary | ICD-10-CM

## 2024-07-20 DIAGNOSIS — Z7689 Persons encountering health services in other specified circumstances: Secondary | ICD-10-CM

## 2024-07-20 NOTE — Progress Notes (Signed)
 Pb today = 138/96  and Pulse 60  She is not getting any pb medication  We need to call her to come back in month for pb check  The patient was called and scheduled

## 2024-07-21 LAB — COMPREHENSIVE METABOLIC PANEL WITH GFR
ALT: 27 IU/L (ref 0–32)
AST: 26 IU/L (ref 0–40)
Albumin: 4.3 g/dL (ref 3.9–4.9)
Alkaline Phosphatase: 82 IU/L (ref 41–116)
BUN/Creatinine Ratio: 18 (ref 9–23)
BUN: 10 mg/dL (ref 6–24)
Bilirubin Total: 0.2 mg/dL (ref 0.0–1.2)
CO2: 21 mmol/L (ref 20–29)
Calcium: 9 mg/dL (ref 8.7–10.2)
Chloride: 105 mmol/L (ref 96–106)
Creatinine, Ser: 0.57 mg/dL (ref 0.57–1.00)
Globulin, Total: 2.7 g/dL (ref 1.5–4.5)
Glucose: 92 mg/dL (ref 70–99)
Potassium: 4.5 mmol/L (ref 3.5–5.2)
Sodium: 139 mmol/L (ref 134–144)
Total Protein: 7 g/dL (ref 6.0–8.5)
eGFR: 111 mL/min/1.73

## 2024-07-21 LAB — LIPID PANEL W/O CHOL/HDL RATIO
Cholesterol, Total: 214 mg/dL — ABNORMAL HIGH (ref 100–199)
HDL: 52 mg/dL
LDL Chol Calc (NIH): 131 mg/dL — ABNORMAL HIGH (ref 0–99)
Triglycerides: 175 mg/dL — ABNORMAL HIGH (ref 0–149)
VLDL Cholesterol Cal: 31 mg/dL (ref 5–40)

## 2024-07-21 LAB — CBC WITH DIFFERENTIAL/PLATELET
Basophils Absolute: 0 x10E3/uL (ref 0.0–0.2)
Basos: 0 %
EOS (ABSOLUTE): 0.1 x10E3/uL (ref 0.0–0.4)
Eos: 2 %
Hematocrit: 38.3 % (ref 34.0–46.6)
Hemoglobin: 12.7 g/dL (ref 11.1–15.9)
Immature Grans (Abs): 0 x10E3/uL (ref 0.0–0.1)
Immature Granulocytes: 0 %
Lymphocytes Absolute: 2.5 x10E3/uL (ref 0.7–3.1)
Lymphs: 36 %
MCH: 28.7 pg (ref 26.6–33.0)
MCHC: 33.2 g/dL (ref 31.5–35.7)
MCV: 87 fL (ref 79–97)
Monocytes Absolute: 0.5 x10E3/uL (ref 0.1–0.9)
Monocytes: 7 %
Neutrophils Absolute: 3.8 x10E3/uL (ref 1.4–7.0)
Neutrophils: 55 %
Platelets: 418 x10E3/uL (ref 150–450)
RBC: 4.43 x10E6/uL (ref 3.77–5.28)
RDW: 13.2 % (ref 11.7–15.4)
WBC: 7.1 x10E3/uL (ref 3.4–10.8)

## 2024-08-23 ENCOUNTER — Ambulatory Visit: Payer: Self-pay | Admitting: Internal Medicine

## 2024-08-23 VITALS — BP 120/80 | HR 77

## 2024-08-23 DIAGNOSIS — Z0130 Encounter for examination of blood pressure without abnormal findings: Secondary | ICD-10-CM

## 2024-08-23 NOTE — Progress Notes (Unsigned)
 Bp 120/80   pulse 77  The patient is not any medications

## 2024-08-23 NOTE — Progress Notes (Unsigned)
 Bp okay.  No medication

## 2024-08-24 NOTE — Progress Notes (Signed)
 Patient has been notified that her bp was fine and no medication is needed.

## 2025-01-07 ENCOUNTER — Encounter: Payer: Self-pay | Admitting: Internal Medicine
